# Patient Record
Sex: Female | Born: 1985 | Race: White | Hispanic: No | Smoking: Never smoker
Health system: Southern US, Community
[De-identification: ages and names within clinical notes are randomized; demographics above are authoritative.]

## PROBLEM LIST (undated history)

## (undated) DIAGNOSIS — G43909 Migraine, unspecified, not intractable, without status migrainosus: Secondary | ICD-10-CM

## (undated) DIAGNOSIS — R011 Cardiac murmur, unspecified: Secondary | ICD-10-CM

## (undated) DIAGNOSIS — K90829 Short bowel syndrome, unspecified: Secondary | ICD-10-CM

## (undated) DIAGNOSIS — E538 Deficiency of other specified B group vitamins: Secondary | ICD-10-CM

## (undated) DIAGNOSIS — K912 Postsurgical malabsorption, not elsewhere classified: Secondary | ICD-10-CM

## (undated) HISTORY — DX: Deficiency of other specified B group vitamins: E53.8

## (undated) HISTORY — DX: Short bowel syndrome, unspecified: K90.829

## (undated) HISTORY — DX: Postsurgical malabsorption, not elsewhere classified: K91.2

## (undated) HISTORY — PX: ABDOMINAL SURGERY: SHX537

---

## 2008-07-02 ENCOUNTER — Inpatient Hospital Stay (HOSPITAL_COMMUNITY): Admission: AD | Admit: 2008-07-02 | Discharge: 2008-07-02 | Payer: Self-pay | Admitting: Family Medicine

## 2008-10-07 ENCOUNTER — Inpatient Hospital Stay (HOSPITAL_COMMUNITY): Admission: AD | Admit: 2008-10-07 | Discharge: 2008-10-07 | Payer: Self-pay | Admitting: Obstetrics and Gynecology

## 2008-11-06 ENCOUNTER — Inpatient Hospital Stay (HOSPITAL_COMMUNITY): Admission: AD | Admit: 2008-11-06 | Discharge: 2008-11-06 | Payer: Self-pay | Admitting: Obstetrics and Gynecology

## 2008-11-06 ENCOUNTER — Ambulatory Visit: Payer: Self-pay | Admitting: Advanced Practice Midwife

## 2008-11-13 ENCOUNTER — Inpatient Hospital Stay (HOSPITAL_COMMUNITY): Admission: AD | Admit: 2008-11-13 | Discharge: 2008-11-13 | Payer: Self-pay | Admitting: Obstetrics & Gynecology

## 2008-11-17 ENCOUNTER — Inpatient Hospital Stay (HOSPITAL_COMMUNITY): Admission: AD | Admit: 2008-11-17 | Discharge: 2008-11-19 | Payer: Self-pay | Admitting: Obstetrics and Gynecology

## 2008-11-17 ENCOUNTER — Ambulatory Visit: Payer: Self-pay | Admitting: Family

## 2010-05-27 LAB — CBC
Hemoglobin: 10.5 g/dL — ABNORMAL LOW (ref 12.0–15.0)
MCHC: 32.8 g/dL (ref 30.0–36.0)
MCV: 87.1 fL (ref 78.0–100.0)
MCV: 88.6 fL (ref 78.0–100.0)
Platelets: 212 10*3/uL (ref 150–400)
Platelets: 264 10*3/uL (ref 150–400)
RBC: 2.64 MIL/uL — ABNORMAL LOW (ref 3.87–5.11)
RBC: 3.06 MIL/uL — ABNORMAL LOW (ref 3.87–5.11)
RDW: 15.4 % (ref 11.5–15.5)
WBC: 17.3 10*3/uL — ABNORMAL HIGH (ref 4.0–10.5)
WBC: 7.6 10*3/uL (ref 4.0–10.5)

## 2010-05-27 LAB — STREP B DNA PROBE: Strep Group B Ag: NEGATIVE

## 2010-05-27 LAB — RAPID URINE DRUG SCREEN, HOSP PERFORMED
Amphetamines: NOT DETECTED
Barbiturates: NOT DETECTED
Benzodiazepines: NOT DETECTED
Opiates: NOT DETECTED
Tetrahydrocannabinol: NOT DETECTED

## 2010-05-27 LAB — COMPREHENSIVE METABOLIC PANEL
ALT: 40 U/L — ABNORMAL HIGH (ref 0–35)
AST: 46 U/L — ABNORMAL HIGH (ref 0–37)
AST: 49 U/L — ABNORMAL HIGH (ref 0–37)
Alkaline Phosphatase: 135 U/L — ABNORMAL HIGH (ref 39–117)
Alkaline Phosphatase: 154 U/L — ABNORMAL HIGH (ref 39–117)
BUN: 8 mg/dL (ref 6–23)
CO2: 21 mEq/L (ref 19–32)
CO2: 21 mEq/L (ref 19–32)
Chloride: 106 mEq/L (ref 96–112)
Chloride: 108 mEq/L (ref 96–112)
Creatinine, Ser: 0.42 mg/dL (ref 0.4–1.2)
Creatinine, Ser: 0.47 mg/dL (ref 0.4–1.2)
GFR calc Af Amer: >60 mL/min (ref >60)
GFR calc non Af Amer: >60 mL/min (ref >60)
GFR calc non Af Amer: >60 mL/min (ref >60)
Potassium: 3.8 mEq/L (ref 3.5–5.1)
Potassium: 3.9 mEq/L (ref 3.5–5.1)
Sodium: 134 mEq/L — ABNORMAL LOW (ref 135–145)
Total Bilirubin: 0.6 mg/dL (ref 0.3–1.2)
Total Bilirubin: 0.8 mg/dL (ref 0.3–1.2)

## 2010-05-27 LAB — URINE MICROSCOPIC-ADD ON

## 2010-05-27 LAB — URINALYSIS, ROUTINE W REFLEX MICROSCOPIC
Bilirubin Urine: NEGATIVE
Bilirubin Urine: NEGATIVE
Hgb urine dipstick: NEGATIVE
Nitrite: NEGATIVE
Protein, ur: NEGATIVE mg/dL
Protein, ur: NEGATIVE mg/dL
Specific Gravity, Urine: 1.01 (ref 1.005–1.030)
Specific Gravity, Urine: 1.015 (ref 1.005–1.030)
Urobilinogen, UA: 0.2 mg/dL (ref 0.0–1.0)
Urobilinogen, UA: 0.2 mg/dL (ref 0.0–1.0)

## 2010-05-27 LAB — DIFFERENTIAL
Basophils Absolute: 0 10*3/uL (ref 0.0–0.1)
Basophils Relative: 0 % (ref 0–1)
Eosinophils Absolute: 0 10*3/uL (ref 0.0–0.7)
Eosinophils Relative: 1 % (ref 0–5)

## 2010-05-27 LAB — HEPATITIS B SURFACE ANTIGEN: Hepatitis B Surface Ag: NEGATIVE

## 2010-05-27 LAB — CCBB MATERNAL DONOR DRAW

## 2010-05-27 LAB — RPR: RPR Ser Ql: NONREACTIVE

## 2010-05-27 LAB — GC/CHLAMYDIA PROBE AMP, GENITAL: Chlamydia, DNA Probe: NEGATIVE

## 2010-05-27 LAB — WET PREP, GENITAL: Yeast Wet Prep HPF POC: NONE SEEN

## 2010-05-31 LAB — GC/CHLAMYDIA PROBE AMP, GENITAL
Chlamydia, DNA Probe: NEGATIVE
GC Probe Amp, Genital: NEGATIVE

## 2010-05-31 LAB — WET PREP, GENITAL: Yeast Wet Prep HPF POC: NONE SEEN

## 2010-05-31 LAB — URINALYSIS, ROUTINE W REFLEX MICROSCOPIC
Glucose, UA: NEGATIVE mg/dL
Ketones, ur: NEGATIVE mg/dL
Protein, ur: NEGATIVE mg/dL
Urobilinogen, UA: 0.2 mg/dL (ref 0.0–1.0)

## 2010-05-31 LAB — DIFFERENTIAL
Eosinophils Absolute: 0 10*3/uL (ref 0.0–0.7)
Lymphocytes Relative: 27 % (ref 12–46)
Lymphs Abs: 1.9 10*3/uL (ref 0.7–4.0)
Neutrophils Relative %: 63 % (ref 43–77)

## 2010-05-31 LAB — CBC
Platelets: 202 10*3/uL (ref 150–400)
WBC: 7.1 10*3/uL (ref 4.0–10.5)

## 2010-09-19 ENCOUNTER — Encounter: Payer: Self-pay | Admitting: Family Medicine

## 2011-06-30 ENCOUNTER — Encounter (HOSPITAL_COMMUNITY): Payer: Self-pay

## 2011-06-30 ENCOUNTER — Emergency Department (INDEPENDENT_AMBULATORY_CARE_PROVIDER_SITE_OTHER)
Admission: EM | Admit: 2011-06-30 | Discharge: 2011-06-30 | Disposition: A | Discharge status: Home / Self Care | Payer: Self-pay | Source: Home / Self Care | Attending: Emergency Medicine | Admitting: Emergency Medicine

## 2011-06-30 DIAGNOSIS — N390 Urinary tract infection, site not specified: Secondary | ICD-10-CM

## 2011-06-30 LAB — POCT URINALYSIS DIP (DEVICE)
Protein, ur: 100 mg/dL — AB
Urobilinogen, UA: 0.2 mg/dL (ref 0.0–1.0)

## 2011-06-30 LAB — POCT PREGNANCY, URINE: Preg Test, Ur: NEGATIVE

## 2011-06-30 MED ORDER — NITROFURANTOIN MONOHYD MACRO 100 MG PO CAPS: 100.0000 mg | ORAL_CAPSULE | Freq: Two times a day (BID) | ORAL | Status: AC

## 2011-06-30 NOTE — ED Provider Notes (Signed)
History     CSN: [id number]  Arrival date & time 06/30/11  1238   First MD Initiated Contact with Patient 06/30/11 1252      Chief Complaint  Patient presents with  . Vaginitis    (Consider location/radiation/quality/duration/timing/severity/associated sxs/prior treatment) Patient is a 26 y.o. female presenting with dysuria. The history is provided by the patient. No language interpreter was used.  Dysuria  This is a new problem. The current episode started yesterday. The problem occurs every urination. The problem has been gradually worsening. The pain is at a severity of 6/10. The pain is moderate. There has been no fever. The fever has been present for 3 to 4 days. She is not sexually active. There is no history of pyelonephritis. Associated symptoms include urgency. Pertinent negatives include no discharge, no hematuria and no hesitancy. Her past medical history does not include kidney stones or recurrent UTIs.    History reviewed. No pertinent past medical history.  History reviewed. No pertinent past surgical history.  History reviewed. No pertinent family history.  History  Substance Use Topics  . Smoking status: Never Smoker   . Smokeless tobacco: Never Used  . Alcohol Use: Yes    OB History    Grav Para Term Preterm Abortions TAB SAB Ect Mult Living                  Review of Systems  Genitourinary: Positive for dysuria and urgency. Negative for hesitancy and hematuria.  All other systems reviewed and are negative.    Allergies  Pollen extract  Home Medications  No current outpatient prescriptions on file.  BP 118/68  Pulse 78  Temp(Src) 97.8 F (36.6 C) (Oral)  Resp 18  SpO2 96%  LMP 06/26/2011  Physical Exam  Nursing note and vitals reviewed. Constitutional: She is oriented to person, place, and time. She appears well-developed and well-nourished.  HENT:  Head: Normocephalic and atraumatic.  Right Ear: External ear normal.  Left Ear:  External ear normal.  Eyes: Pupils are equal, round, and reactive to light.  Neck: Normal range of motion.  Cardiovascular: Normal rate.   Pulmonary/Chest: Effort normal.  Abdominal: Soft.  Musculoskeletal: Normal range of motion.  Neurological: She is alert and oriented to person, place, and time.  Skin: Skin is warm.  Psychiatric: She has a normal mood and affect.    ED Course  Procedures (including critical care time)  Labs Reviewed  POCT URINALYSIS DIP (DEVICE) - Abnormal; Notable for the following:    Hgb urine dipstick LARGE (*)    Protein, ur 100 (*)    Leukocytes, UA SMALL (*) Biochemical Testing Only. Please order routine urinalysis from main lab if confirmatory testing is needed.   All other components within normal limits  POCT PREGNANCY, URINE   No results found.   No diagnosis found.    MDM  Urine shows small leukocytes and large hemoglobin        Lonia Skinner [place], [place] 06/30/11 1325

## 2011-06-30 NOTE — ED Provider Notes (Signed)
Medical screening examination/treatment/procedure(s) were performed by non-physician practitioner and as supervising physician I was immediately available for consultation/collaboration.  Raynald Blend, MD 06/30/11 1435

## 2011-06-30 NOTE — Discharge Instructions (Signed)
Urinary Tract Infection A urinary tract infection (UTI) is often caused by a germ (bacteria). A UTI is usually helped with medicine (antibiotics) that kills germs. Take all the medicine until it is gone. Do this even if you are feeling better. You are usually better in 7 to 10 days. HOME CARE   Drink enough water and fluids to keep your pee (urine) clear or pale yellow. Drink:   Cranberry juice.   Water.   Avoid:   Caffeine.   Tea.   Bubbly (carbonated) drinks.   Alcohol.   Only take medicine as told by your doctor.   To prevent further infections:   Pee often.   After pooping (bowel movement), women should wipe from front to back. Use each tissue only once.   Pee before and after having sex (intercourse).  Ask your doctor when your test results will be ready. Make sure you follow up and get your test results.  GET HELP RIGHT AWAY IF:   There is very bad back pain or lower belly (abdominal) pain.   You get the chills.   You have a fever.   Your baby is older than 3 months with a rectal temperature of 102 F (38.9 C) or higher.   Your baby is 3 months old or younger with a rectal temperature of 100.4 F (38 C) or higher.   You feel sick to your stomach (nauseous) or throw up (vomit).   There is continued burning with peeing.   Your problems are not better in 3 days. Return sooner if you are getting worse.  MAKE SURE YOU:   Understand these instructions.   Will watch your condition.   Will get help right away if you are not doing well or get worse.  Document Released: 07/26/2007 Document Revised: 01/26/2011 Document Reviewed: 07/26/2007 Exit[employer] Patient Information 2012 [employer]. 

## 2011-06-30 NOTE — ED Notes (Signed)
C/o vaginal d/c , pain for past 2 days, pain w urination; sexually active w/o bc

## 2013-01-27 ENCOUNTER — Encounter (HOSPITAL_COMMUNITY): Payer: Self-pay | Admitting: Emergency Medicine

## 2013-01-27 ENCOUNTER — Emergency Department (INDEPENDENT_AMBULATORY_CARE_PROVIDER_SITE_OTHER)
Admission: EM | Admit: 2013-01-27 | Discharge: 2013-01-27 | Disposition: A | Discharge status: Home / Self Care | Payer: Self-pay | Source: Home / Self Care | Attending: Family Medicine | Admitting: Family Medicine

## 2013-01-27 DIAGNOSIS — J069 Acute upper respiratory infection, unspecified: Secondary | ICD-10-CM

## 2013-01-27 LAB — POCT RAPID STREP A: Streptococcus, Group A Screen (Direct): NEGATIVE

## 2013-01-27 MED ORDER — HYDROCODONE-ACETAMINOPHEN 5-325 MG PO TABS: 0.5000 | ORAL_TABLET | Freq: Every evening | ORAL | Status: DC | PRN

## 2013-01-27 MED ORDER — IPRATROPIUM BROMIDE 0.06 % NA SOLN: 2.0000 | Freq: Four times a day (QID) | NASAL | Status: DC

## 2013-01-27 MED ORDER — AZITHROMYCIN 250 MG PO TABS: 250.0000 mg | ORAL_TABLET | Freq: Every day | ORAL | Status: DC

## 2013-01-27 NOTE — ED Notes (Signed)
C/o St , fever x 1 week

## 2013-01-27 NOTE — ED Provider Notes (Signed)
Judy Fisher is a 27 y.o. female who presents to Urgent Care today for sore throat, nasal discharge, coughing congestion. This is been present for about one week. Patient has used over-the-counter medications which helped only a little. No chest pain trouble breathing nausea vomiting or diarrhea. Mild subjective fevers present. Patient feels well otherwise.   Past Medical History  Diagnosis Date  . B12 deficiency   . Short bowel syndrome    History  Substance Use Topics  . Smoking status: Never Smoker   . Smokeless tobacco: Not on file  . Alcohol Use: Not on file   ROS as above Medications reviewed. No current facility-administered medications for this encounter.   Current Outpatient Prescriptions  Medication Sig Dispense Refill  . azithromycin (ZITHROMAX) 250 MG tablet Take 1 tablet (250 mg total) by mouth daily. Take first 2 tablets together, then 1 every day until finished.  6 tablet  0  . HYDROcodone-acetaminophen (NORCO/VICODIN) 5-325 MG per tablet Take 0.5 tablets by mouth at bedtime as needed (cough).  6 tablet  0  . ipratropium (ATROVENT) 0.06 % nasal spray Place 2 sprays into both nostrils 4 (four) times daily.  15 mL  1    Exam:  BP 130/89  Pulse 63  Temp(Src) 97.3 F (36.3 C) (Oral)  Resp 20  SpO2 99% Gen: Well NAD HEENT: EOMI,  MMM, posterior pharynx with mild erythema and cobblestoning. Tympanic membranes are normal appearing on the left. Occluded by cerumen on the right. Lungs: Normal work of breathing. CTABL Heart: RRR no MRG Abd: NABS, Soft. NT, ND Exts: Non edematous BL  LE, warm and well perfused.   Results for orders placed during the hospital encounter of 01/27/13 (from the past 24 hour(s))  POCT RAPID STREP A ([facility] ONLY)     Status: None   Collection Time    01/27/13  2:51 PM      Result Value Range   Streptococcus, Group A Screen (Direct) NEGATIVE  NEGATIVE   No results found.  Assessment and Plan: 27 y.o. female with viral URI. Plan for  symptomatic management with Atrovent nasal spray, cough medication, and we'll use azithromycin if patient does not improve. Discussed warning signs or symptoms. Please see discharge instructions. Patient expresses understanding.      Rodolph Bong, MD 01/27/13 [phone]

## 2013-01-29 LAB — CULTURE, GROUP A STREP

## 2014-04-19 ENCOUNTER — Emergency Department (HOSPITAL_COMMUNITY)
Admission: EM | Admit: 2014-04-19 | Discharge: 2014-04-19 | Disposition: A | Discharge status: Home / Self Care | Payer: Worker's Compensation | Attending: Emergency Medicine | Admitting: Emergency Medicine

## 2014-04-19 ENCOUNTER — Emergency Department (HOSPITAL_COMMUNITY): Payer: Worker's Compensation

## 2014-04-19 ENCOUNTER — Encounter (HOSPITAL_COMMUNITY): Payer: Self-pay | Admitting: Physical Medicine and Rehabilitation

## 2014-04-19 DIAGNOSIS — Z79899 Other long term (current) drug therapy: Secondary | ICD-10-CM | POA: Insufficient documentation

## 2014-04-19 DIAGNOSIS — Z3202 Encounter for pregnancy test, result negative: Secondary | ICD-10-CM | POA: Insufficient documentation

## 2014-04-19 DIAGNOSIS — Z792 Long term (current) use of antibiotics: Secondary | ICD-10-CM | POA: Diagnosis not present

## 2014-04-19 DIAGNOSIS — S39012A Strain of muscle, fascia and tendon of lower back, initial encounter: Secondary | ICD-10-CM | POA: Insufficient documentation

## 2014-04-19 DIAGNOSIS — X58XXXA Exposure to other specified factors, initial encounter: Secondary | ICD-10-CM | POA: Diagnosis not present

## 2014-04-19 DIAGNOSIS — Z8639 Personal history of other endocrine, nutritional and metabolic disease: Secondary | ICD-10-CM | POA: Insufficient documentation

## 2014-04-19 DIAGNOSIS — Z8719 Personal history of other diseases of the digestive system: Secondary | ICD-10-CM | POA: Insufficient documentation

## 2014-04-19 DIAGNOSIS — S3992XA Unspecified injury of lower back, initial encounter: Secondary | ICD-10-CM | POA: Diagnosis present

## 2014-04-19 DIAGNOSIS — Y9389 Activity, other specified: Secondary | ICD-10-CM | POA: Diagnosis not present

## 2014-04-19 DIAGNOSIS — Y9289 Other specified places as the place of occurrence of the external cause: Secondary | ICD-10-CM | POA: Diagnosis not present

## 2014-04-19 DIAGNOSIS — Y990 Civilian activity done for income or pay: Secondary | ICD-10-CM | POA: Diagnosis not present

## 2014-04-19 DIAGNOSIS — R509 Fever, unspecified: Secondary | ICD-10-CM

## 2014-04-19 LAB — URINALYSIS, ROUTINE W REFLEX MICROSCOPIC
Bilirubin Urine: NEGATIVE
Glucose, UA: NEGATIVE mg/dL
HGB URINE DIPSTICK: NEGATIVE
KETONES UR: 15 mg/dL — AB
LEUKOCYTES UA: NEGATIVE
NITRITE: NEGATIVE
PH: 7.5 (ref 5.0–8.0)
Protein, ur: NEGATIVE mg/dL
Specific Gravity, Urine: 1.019 (ref 1.005–1.030)
Urobilinogen, UA: 1 mg/dL (ref 0.0–1.0)

## 2014-04-19 LAB — POC URINE PREG, ED: Preg Test, Ur: NEGATIVE

## 2014-04-19 MED ORDER — IBUPROFEN 100 MG/5ML PO SUSP: 10.0000 mg/kg | Freq: Three times a day (TID) | ORAL | Status: DC | PRN

## 2014-04-19 MED ORDER — IBUPROFEN 100 MG/5ML PO SUSP
10.0000 mg/kg | Freq: Once | ORAL | Status: AC
  Administered 2014-04-19: 650 mg via ORAL
  Filled 2014-04-19 (×2): qty 40

## 2014-04-19 NOTE — ED Provider Notes (Signed)
CSN: [id number]     Arrival date & time 04/19/14  [id number] History  This chart was scribed for non-physician practitioner, Fayrene HelperBowie Dreyah Montrose, PA-C,working with Vida RollerBrian D Miller, MD, by Karle PlumberJennifer Tensley, ED Scribe. This patient was seen in room TR05C/TR05C and the patient's care was started at 7:24 PM.  Chief Complaint  Patient presents with  . Back Pain   Patient is a 29 y.o. female presenting with back pain. The history is provided by the patient and medical records. No language interpreter was used.  Back Pain Associated symptoms: no fever and no numbness     HPI Comments:  Judy Fisher is a 29 y.o. female who presents to the Emergency Department complaining of severe, sharp back pain that began approximately 7-8 hours ago. She states she lifts heavy objects at work on a regular basis and uses proper techniques to do so. She states the pain is in her lower back and travels all the way up her back to the neck. She reports movements and walking makes the pain worse. Denies alleviating factors. She states she took on epsom salt bath with no significant relief of the pain. Denies CP, SOB, fever, cough, abdominal pain, problems with urination, bowel or bladder incontinence, numbness, tingling or weakness of any extremity or rash.     Past Medical History  Diagnosis Date  . B12 deficiency   . Short bowel syndrome    History reviewed. No pertinent past surgical history. No family history on file. History  Substance Use Topics  . Smoking status: Never Smoker   . Smokeless tobacco: Not on file  . Alcohol Use: No   OB History    No data available     Review of Systems  Constitutional: Negative for fever.  Musculoskeletal: Positive for back pain.  Neurological: Negative for numbness.    Allergies  Review of patient's allergies indicates no known allergies.  Home Medications   Prior to Admission medications   Medication Sig Start Date End Date Taking? Authorizing Provider  azithromycin  (ZITHROMAX) 250 MG tablet Take 1 tablet (250 mg total) by mouth daily. Take first 2 tablets together, then 1 every day until finished. 01/27/13   Rodolph BongEvan S Corey, MD  HYDROcodone-acetaminophen (NORCO/VICODIN) 5-325 MG per tablet Take 0.5 tablets by mouth at bedtime as needed (cough). 01/27/13   Rodolph BongEvan S Corey, MD  ipratropium (ATROVENT) 0.06 % nasal spray Place 2 sprays into both nostrils 4 (four) times daily. 01/27/13   Rodolph BongEvan S Corey, MD   Triage Vitals: BP 108/66 mmHg  Pulse 102  Temp(Src) 98.8 F (37.1 C)  Resp 16  SpO2 99% Physical Exam  Constitutional: She is oriented to person, place, and time. She appears well-developed and well-nourished.  HENT:  Head: Normocephalic and atraumatic.  Eyes: EOM are normal.  Neck: Normal range of motion.  Cardiovascular: Normal rate and intact distal pulses.   Pulmonary/Chest: Effort normal.  Abdominal: Soft. There is no tenderness.  Musculoskeletal: Normal range of motion. She exhibits tenderness (No sig midline tenderness, step off or crepitus. Tenderness throughout paraspinal muslce of left side involving thoracic and paraspinal muscles. Able to ambulate.).  Neurological: She is alert and oriented to person, place, and time. She has normal reflexes.  Skin: Skin is warm and dry.  Psychiatric: She has a normal mood and affect. Her behavior is normal.  Nursing note and vitals reviewed.   ED Course  Procedures (including critical care time) DIAGNOSTIC STUDIES: Oxygen Saturation is 99% on RA, normal by my interpretation.  COORDINATION OF CARE: 7:29 PM- Will prescribe Pt verbalizes understanding and agrees to plan.  7:57 PM Pt with reproducible L back pain 2/2 muscle strain.  No red flags.  RICE therapy discussed.  10:23 PM On vital sign rechecks, patient has a fever of 102.6 and was tachycardic. She denies having significant headache, neck stiffness, chest pain, productive cough, abdominal pain, pelvic pain dysuria, or rash. She has no midline spine  tenderness and no evidence of epidural abscess. Chest x-ray, UA, and pregnancy test was normal. Fever has resolved with ibuprofen. No history of IV drug use, no other red flags. Low suspicion for spinal infection or discitis. Patient felt better at this time. I strongly encouraged for patient to return if her symptoms worsen.  Medications  ibuprofen (ADVIL,MOTRIN) 100 MG/5ML suspension 650 mg (650 mg Oral Given 04/19/14 2028)    Labs Review Labs Reviewed  URINALYSIS, ROUTINE W REFLEX MICROSCOPIC - Abnormal; Notable for the following:    Ketones, ur 15 (*)    All other components within normal limits  POC URINE PREG, ED    Imaging Review Dg Chest 2 View  04/19/2014   CLINICAL DATA:  Fever and upper back pain.  EXAM: CHEST  2 VIEW  COMPARISON:  None.  FINDINGS: Normal heart size and mediastinal contours. No acute infiltrate or edema. No effusion or pneumothorax. No acute osseous findings.  IMPRESSION: Negative chest.   Electronically Signed   By: Marnee Spring M.D.   On: 04/19/2014 20:55     EKG Interpretation None      MDM   Final diagnoses:  Back strain, initial encounter  Fever    BP 108/66 mmHg  Pulse 102  Temp(Src) 98.8 F (37.1 C)  Resp 16  SpO2 99%   I personally performed the services described in this documentation, which was scribed in my presence. The recorded information has been reviewed and is accurate.      Fayrene Helper, PA-C 04/19/14 2225  Vida Roller, MD 04/20/14 2008

## 2014-04-19 NOTE — ED Notes (Signed)
Pt presents to department for evaluation of back pain. 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 Reports pain radiating down spine. 10/10 pain upon arrival, increases with movement.

## 2014-04-19 NOTE — ED Notes (Signed)
Pt reports she can not take any pills all meds need to be in liquid form.

## 2014-04-19 NOTE — ED Notes (Signed)
[place]  Discharge on hold for now, orders obtained.  Advised pt PA plan. Pt verbalized understanding.

## 2014-04-19 NOTE — Discharge Instructions (Signed)

## 2014-11-23 ENCOUNTER — Emergency Department (INDEPENDENT_AMBULATORY_CARE_PROVIDER_SITE_OTHER)
Admission: EM | Admit: 2014-11-23 | Discharge: 2014-11-23 | Disposition: A | Discharge status: Home / Self Care | Payer: Self-pay | Source: Home / Self Care | Attending: Family Medicine | Admitting: Family Medicine

## 2014-11-23 ENCOUNTER — Encounter (HOSPITAL_COMMUNITY): Payer: Self-pay | Admitting: *Deleted

## 2014-11-23 DIAGNOSIS — J069 Acute upper respiratory infection, unspecified: Secondary | ICD-10-CM

## 2014-11-23 DIAGNOSIS — H6123 Impacted cerumen, bilateral: Secondary | ICD-10-CM

## 2014-11-23 LAB — POCT RAPID STREP A: STREPTOCOCCUS, GROUP A SCREEN (DIRECT): NEGATIVE

## 2014-11-23 MED ORDER — IPRATROPIUM BROMIDE 0.06 % NA SOLN: 2.0000 | Freq: Four times a day (QID) | NASAL | Status: DC

## 2014-11-23 NOTE — ED Notes (Signed)
Pt  Reports  Symptoms  Of  sorethroat  /   Congested  /  Cough     With  Sinus  Drainage

## 2014-11-23 NOTE — ED Provider Notes (Signed)
CSN: [id number]     Arrival date & time 11/23/14  1833 History   First MD Initiated Contact with Patient 11/23/14 2014     Chief Complaint  Patient presents with  . Sore Throat   (Consider location/radiation/quality/duration/timing/severity/associated sxs/prior Treatment) Patient is a 29 y.o. female presenting with pharyngitis. The history is provided by the patient.  Sore Throat This is a new problem. The current episode started 2 days ago. The problem has not changed since onset.Pertinent negatives include no chest pain and no abdominal pain. Associated symptoms comments: Ears plugged bilat.Marland Kitchen    History reviewed. No pertinent past medical history. No past surgical history on file. No family history on file. Social History  Substance Use Topics  . Smoking status: Never Smoker   . Smokeless tobacco: Never Used  . Alcohol Use: No   OB History    No data available     Review of Systems  Constitutional: Negative.   HENT: Positive for congestion, ear pain, postnasal drip, rhinorrhea and sore throat.   Respiratory: Negative.   Cardiovascular: Negative.  Negative for chest pain.  Gastrointestinal: Negative for abdominal pain.  All other systems reviewed and are negative.   Allergies  Pollen extract  Home Medications   Prior to Admission medications   Medication Sig Start Date End Date Taking? Authorizing Provider  ipratropium (ATROVENT) 0.06 % nasal spray Place 2 sprays into both nostrils 4 (four) times daily. 11/23/14   Linna Hoff, MD   Meds Ordered and Administered this Visit  Medications - No data to display  BP 120/78 mmHg  Pulse 57  Temp(Src) 98 F (36.7 C) (Oral)  Resp 12  SpO2 97%  LMP 11/02/2014 No data found.   Physical Exam  Constitutional: She is oriented to person, place, and time. She appears well-developed and well-nourished. No distress.  HENT:  Right Ear: No drainage. A foreign body is present. No mastoid tenderness. Decreased hearing is noted.    Left Ear: No drainage. A foreign body is present. No mastoid tenderness. Decreased hearing is noted.  Ears:  Mouth/Throat: Oropharynx is clear and moist.  Eyes: Pupils are equal, round, and reactive to light.  Neck: Normal range of motion. Neck supple.  Lymphadenopathy:    She has no cervical adenopathy.  Neurological: She is alert and oriented to person, place, and time.  Skin: Skin is warm and dry.  Nursing note and vitals reviewed.   ED Course  Procedures (including critical care time)  Labs Review Labs Reviewed  POCT RAPID STREP A    Imaging Review No results found.   Visual Acuity Review  Right Eye Distance:   Left Eye Distance:   Bilateral Distance:    Right Eye Near:   Left Eye Near:    Bilateral Near:         MDM   1. URI (upper respiratory infection)   2. Cerumen impaction, bilateral        Linna Hoff, MD 11/23/14 2053

## 2014-11-26 LAB — CULTURE, GROUP A STREP: Strep A Culture: NEGATIVE

## 2014-11-30 NOTE — ED Notes (Signed)
Final report of strep testing negative, no further action required 

## 2015-04-12 ENCOUNTER — Emergency Department (HOSPITAL_COMMUNITY)
Admission: EM | Admit: 2015-04-12 | Discharge: 2015-04-12 | Disposition: A | Discharge status: Home / Self Care | Payer: Self-pay | Attending: Emergency Medicine | Admitting: Emergency Medicine

## 2015-04-12 ENCOUNTER — Emergency Department (HOSPITAL_COMMUNITY): Payer: Self-pay

## 2015-04-12 ENCOUNTER — Encounter (HOSPITAL_COMMUNITY): Payer: Self-pay | Admitting: Family Medicine

## 2015-04-12 DIAGNOSIS — R008 Other abnormalities of heart beat: Secondary | ICD-10-CM | POA: Insufficient documentation

## 2015-04-12 DIAGNOSIS — Z8639 Personal history of other endocrine, nutritional and metabolic disease: Secondary | ICD-10-CM | POA: Insufficient documentation

## 2015-04-12 DIAGNOSIS — Z79899 Other long term (current) drug therapy: Secondary | ICD-10-CM | POA: Insufficient documentation

## 2015-04-12 DIAGNOSIS — Z8719 Personal history of other diseases of the digestive system: Secondary | ICD-10-CM | POA: Insufficient documentation

## 2015-04-12 DIAGNOSIS — R079 Chest pain, unspecified: Secondary | ICD-10-CM | POA: Insufficient documentation

## 2015-04-12 LAB — BASIC METABOLIC PANEL
Anion gap: 7 (ref 5–15)
BUN: 8 mg/dL (ref 6–20)
CHLORIDE: 107 mmol/L (ref 101–111)
CO2: 26 mmol/L (ref 22–32)
Calcium: 8.7 mg/dL — ABNORMAL LOW (ref 8.9–10.3)
Creatinine, Ser: 0.65 mg/dL (ref 0.44–1.00)
GFR calc Af Amer: >60 mL/min (ref >60)
GFR calc non Af Amer: >60 mL/min (ref >60)
GLUCOSE: 104 mg/dL — AB (ref 65–99)
POTASSIUM: 3.8 mmol/L (ref 3.5–5.1)
Sodium: 140 mmol/L (ref 135–145)

## 2015-04-12 LAB — CBC
HEMATOCRIT: 34.5 % — AB (ref 36.0–46.0)
Hemoglobin: 11.7 g/dL — ABNORMAL LOW (ref 12.0–15.0)
MCH: 29.3 pg (ref 26.0–34.0)
MCHC: 33.9 g/dL (ref 30.0–36.0)
MCV: 86.3 fL (ref 78.0–100.0)
Platelets: 230 10*3/uL (ref 150–400)
RBC: 4 MIL/uL (ref 3.87–5.11)
RDW: 13 % (ref 11.5–15.5)
WBC: 7.5 10*3/uL (ref 4.0–10.5)

## 2015-04-12 LAB — I-STAT TROPONIN, ED
Troponin i, poc: 0 ng/mL (ref 0.00–0.08)
Troponin i, poc: 0 ng/mL (ref 0.00–0.08)

## 2015-04-12 MED ORDER — ACETAMINOPHEN 325 MG PO TABS
650.0000 mg | ORAL_TABLET | Freq: Once | ORAL | Status: AC
  Administered 2015-04-12: 650 mg via ORAL
  Filled 2015-04-12: qty 2

## 2015-04-12 NOTE — ED Notes (Signed)
Pt is complaining of generalized chest pain that started yesterday morning around 9am. Pt reports she noticed shortness of breath or heavy breathing around 8am.

## 2015-04-12 NOTE — ED Provider Notes (Signed)
CSN: [id number]     Arrival date & time 04/12/15  0148 History   First MD Initiated Contact with Patient 04/12/15 0630     Chief Complaint  Patient presents with  . Chest Pain     (Consider location/radiation/quality/duration/timing/severity/associated sxs/prior Treatment) HPI   Judy Fisher is a 30 y.o. female, with a history of B12 deficiency, presenting to the ED with chest pressure and heavy breathing that started yesterday at about 9am. 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 Pt has not taken anything for the pain. Pt rates her pain at 7/10 when it started, but now rates it at a 1/10, which is the lowest its been for the last 24 hours. The pain is nonradiating. Pt states she has felt this pain before, but never this bad. Pt states the pain improved with "slow breathing, not thinking about stressful things, and just relaxing." Pt denies N/V, shortness of breath, diaphoresis, cough, or any other complaints. Patient adds that her father has had 3 MIs, with the first one after age 31.   Past Medical History  Diagnosis Date  . B12 deficiency   . Short bowel syndrome    History reviewed. No pertinent past surgical history. Family History  Problem Relation Age of Onset  . Hypertension Father   . Heart attack Father 17    Three separate MIs   Social History  Substance Use Topics  . Smoking status: Never Smoker   . Smokeless tobacco: None  . Alcohol Use: No   OB History    No data available     Review of Systems  Constitutional: Negative for fever, chills and diaphoresis.  Respiratory: Negative for shortness of breath.   Cardiovascular: Positive for chest pain.  Gastrointestinal: Negative for nausea and vomiting.  Skin: Negative for color change and pallor.  Neurological: Negative for dizziness, light-headedness and headaches.  All other systems  reviewed and are negative.     Allergies  Review of patient's allergies indicates no known allergies.  Home Medications   Prior to Admission medications   Medication Sig Start Date End Date Taking? Authorizing Provider  azithromycin (ZITHROMAX) 250 MG tablet Take 1 tablet (250 mg total) by mouth daily. Take first 2 tablets together, then 1 every day until finished. 01/27/13   Rodolph Bong, MD  HYDROcodone-acetaminophen (NORCO/VICODIN) 5-325 MG per tablet Take 0.5 tablets by mouth at bedtime as needed (cough). 01/27/13   Rodolph Bong, MD  ibuprofen (ADVIL,MOTRIN) 100 MG/5ML suspension Take 32.5 mLs (650 mg total) by mouth every 8 (eight) hours as needed. 04/19/14   Fayrene Helper, PA-C  ipratropium (ATROVENT) 0.06 % nasal spray Place 2 sprays into both nostrils 4 (four) times daily. 01/27/13   Rodolph Bong, MD   BP 100/72 mmHg  Pulse 60  Temp(Src) 98.3 F (36.8 C) (Oral)  Resp 14  Ht  (1.549 m)  Wt 45.36 kg  BMI 18.90 kg/m2  SpO2 100%  LMP 04/11/2015 Physical Exam  Constitutional: She is oriented to person, place, and time. She appears well-developed and well-nourished. No distress.  HENT:  Head: Normocephalic and atraumatic.  Eyes: Conjunctivae are normal. Pupils are equal, round, and reactive to light.  Neck: Neck supple.  Cardiovascular: Normal rate, regular rhythm and intact distal pulses.  Exam reveals gallop and S3.   Pulmonary/Chest: Effort normal and breath sounds normal. No  respiratory distress.  Abdominal: Soft. Bowel sounds are normal. There is no tenderness. There is no guarding.  Musculoskeletal: She exhibits no edema or tenderness.  Lymphadenopathy:    She has no cervical adenopathy.  Neurological: She is alert and oriented to person, place, and time.  Skin: Skin is warm and dry. She is not diaphoretic.  Nursing note and vitals reviewed.   ED Course  Procedures (including critical care time) Labs Review Labs Reviewed  BASIC METABOLIC PANEL - Abnormal; Notable  for the following:    Glucose, Bld 104 (*)    Calcium 8.7 (*)    All other components within normal limits  CBC - Abnormal; Notable for the following:    Hemoglobin 11.7 (*)    HCT 34.5 (*)    All other components within normal limits  I-STAT TROPOININ, ED  I-STAT TROPOININ, ED    Imaging Review Dg Chest 2 View  04/12/2015  CLINICAL DATA:  Chest pressure and shortness of breath for 1 day. EXAM: CHEST  2 VIEW COMPARISON:  04/19/2014 FINDINGS: The cardiomediastinal contours are normal. The lungs are clear. Pulmonary vasculature is normal. No consolidation, pleural effusion, or pneumothorax. No acute osseous abnormalities are seen. IMPRESSION: No acute pulmonary process. Electronically Signed   By: Rubye Oaks M.D.   On: 04/12/2015 03:00   I have personally reviewed and evaluated these images and lab results as part of my medical decision-making.   EKG Interpretation   Date/Time:  Monday April 12 2015 02:00:50 EST Ventricular Rate:  74 PR Interval:  149 QRS Duration: 81 QT Interval:  363 QTC Calculation: 403 R Axis:   83 Text Interpretation:  Sinus rhythm Nonspecific T wave abnormality No prior  ECG Confirmed by [facility] MD, ERIN ([id number]) on 04/12/2015 7:01:27 AM      MDM   Final diagnoses:  Chest pain, unspecified chest pain type    Judy Fisher presents with chest pressure for the last 24 hours.  Findings and plan of care discussed with Alvira Monday, MD.  Low suspicion for ACS. HEART score is 1, indicating low risk for a cardiac event. Wells criteria score is 0, indicating low risk for PE. Delta troponins negative. Patient has a new murmur, but she seems to be asymptomatic to it at this time. Patient's pain spontaneously resolved. She shows no signs of fluid overload or heart failure. Patient will need to follow up with PCP within a week. Resource guide was given. The patient was given strict return precautions. Patient voices understanding of these instructions,  accepts the plan, and is comfortable with discharge.  Filed Vitals:   04/12/15 0200 04/12/15 0626  BP: 122/83 100/72  Pulse: 82 60  Temp: 98.3 F (36.8 C)   TempSrc: Oral   Resp: 13 14  Height:  (1.549 m)   Weight: 45.36 kg   SpO2: 100% 100%     Anselm Pancoast, PA-C 04/12/15 0746  Alvira Monday, MD 04/12/15 2318

## 2015-04-12 NOTE — Discharge Instructions (Signed)
You have been seen today for chest pain. Your imaging and lab tests showed no abnormalities. Follow up with PCP within the next week for follow-up and chronic management. Return to ED should symptoms recur. There was a slight murmur detected on your exam. This should be followed up with by a PCP.   Emergency Department Resource Guide 1) Find a Doctor and Pay Out of Pocket Although you won't have to find out who is covered by your insurance plan, it is a good idea to ask around and get recommendations. You will then need to call the office and see if the doctor you have chosen will accept you as a new patient and what types of options they offer for patients who are self-pay. Some doctors offer discounts or will set up payment plans for their patients who do not have insurance, but you will need to ask so you aren't surprised when you get to your appointment.  2) Contact Your Local Health Department Not all health departments have doctors that can see patients for sick visits, but many do, so it is worth a call to see if yours does. If you don't know where your local health department is, you can check in your phone book. The CDC also has a tool to help you locate your state's health department, and many state websites also have listings of all of their local health departments.  3) Find a Walk-in Clinic If your illness is not likely to be very severe or complicated, you may want to try a walk in clinic. These are popping up all over the country in pharmacies, drugstores, and shopping centers. They're usually staffed by nurse practitioners or physician assistants that have been trained to treat common illnesses and complaints. They're usually fairly quick and inexpensive. However, if you have serious medical issues or chronic medical problems, these are probably not your best option.  No Primary Care Doctor: - Call Health Connect at  [phone] - they can help you locate a primary care doctor that   accepts your insurance, provides certain services, etc. - Physician Referral Service- [phone]  Chronic Pain Problems: Organization         Address  Phone   Notes  [facility]  [phone] Patients need to be referred by their primary care doctor.   Medication Assistance: Organization         Address  Phone   Notes  [facility] Medication [facility] [place] [address] [place] [place], [place] [address] [phone] --Must be a resident of [facility] -- Must have NO insurance coverage whatsoever (no Medicaid/ Medicare, etc.) -- The pt. MUST have a primary care doctor that directs their care regularly and follows them in the community   [facility]  [phone]   [employer]  [phone]    Agencies that provide inexpensive medical care: Organization         Address  Phone   Notes  [facility]  [phone]   [facility]    [phone]   [facility] [address] [place], [place] [address] [phone]   [facility] [address] [place] [place] [phone]   Planned Parenthood    [phone]   [facility]    [phone]   [facility]  [address], [place] Phone:  [phone], Fax:  [phone] Hours of  Operation:  9 am - 6 pm, M-F.  Also accepts Medicaid/Medicare and self-pay.  [facility] for [place], [address], [place] Phone: [phone], Fax: [phone]. Hours of Operation:  8:30 am - 5:30 pm, M-F.  Also accepts Medicaid and self-pay.  [facility] [address], [place] Phone: [phone]   [place], [place], [place] [phone], Ext. [phone] Mondays & Thursdays: 7-9 AM.  First 15 patients are seen on a first come, first serve basis.    [place] Providers:  Organization          Address  Phone   Notes  [facility] [address], [place] [phone] Also accepts self-pay patients.  [facility] [address], [place]  [phone]   [place], [address], [place] [phone]   [facility] [address] [place] [phone]   Lucianne Lei [address], [place]   [phone] Only accepts [place] Access [place] patients after they have their name applied to their card.   Self-Pay (no insurance) in [facility]:  Organization         Address  Phone   Notes  Sickle Cell Patients, [facility] [place] [phone]   [facility] [place] [phone]   [facility] [place]  [place], [place], [place] [phone]   [facility]/Dr. Osei-Bonsu  [address] [place] or [address], [place] [phone] Phone number for both [place] and [place] locations is the same.  [facility] [address] [place] [phone]   [facility] [address], [place] or [address] [phone]   [facility] [address], [place] [phone], phone; [phone], fax Sees patients 1st and 3rd Saturday of every month.  Must not qualify for public or private insurance (i.e. Medicaid, Medicare, [place] Health Choice, Veterans' Benefits)  Household income should be no more than 200% of the poverty level The clinic cannot treat you if you are pregnant or think you are pregnant  Sexually transmitted diseases are not treated at the clinic.    Dental Care: Organization         Address  Phone  Notes  [facility] Department of [facility] [place] [phone] Accepts children up to age 47 who are enrolled in [place] or [place]; pregnant women with a Medicaid card; and children who have applied for Medicaid or [place] Health Choice, but were declined, whose parents can pay a reduced fee at time of service.  [facility] Department of [facility]  [address], [place] [phone] Accepts children up to age 82 who are enrolled in [place] or [place]; pregnant women with a Medicaid card; and children who have applied for Medicaid or [place] Health Choice, but were declined, whose parents can pay a reduced fee at time of service.  [facility]  [place] [phone] Patients are seen by appointment only. Walk-ins are not accepted. [place] will see patients  36 years of age and older. Monday - Tuesday (8am-5pm) Most Wednesdays (8:30-5pm) $30 per visit, cash only  [facility] Adult [facility] Access PRO[facility]  [address], [facility] [phone] Patients are seen by appointment only. Walk-ins are not accepted. [place] will see patients 34 years of age and older. One Wednesday Evening (Monthly: Volunteer Based).  $30 per visit, cash only  [place]  [phone] for adults; Children under age 73, call Graduate Pediatric Dentistry at [phone]. Children aged 46-14, please call [phone] to request a pediatric application.  Dental services are provided in all areas of dental care including fillings, crowns and bridges, complete and partial dentures, implants, gum treatment, root canals, and extractions. Preventive care is also provided. Treatment is provided to both adults and children. Patients are selected via a lottery and there is often a waiting list.   [facility] [address] [place]  [phone] [email or web address]   [facility] [address] [place], [place]  [phone], Ext. [phone] Second and Fourth Thursday of each month, opens at 6:30 AM; Clinic ends at 9 AM.  Patients are seen on a first-come first-served basis, and a limited number are seen during each clinic.   [facility]  [address] [place] Danker [place], [place] [phone]   Eligibility Requirements You must have lived in [place], [place], or [place] counties for at least the last three months.   You cannot be eligible for state or federal sponsored [employer], including [employer], [place], or [employer].   You generally cannot be eligible for healthcare insurance through your employer.    How to apply: Eligibility screenings are held every Tuesday and Wednesday afternoon from 1:00 pm until 4:00 pm. You do not need an appointment for the interview!  [facility] [address] [place], [place]   [place]  [place] Department  [place]  [phone]    Behavioral Health Resources in the Community: Intensive Outpatient Programs Organization         Address  Phone  Notes  [facility] [place]. [address] [place], [place] [phone]   [facility] Outpatient [address], [place], [place]   ADS: [facility] [address], [place], [place]   [place] [address]  [place], [place] or [phone]   Substance Abuse Resources Organization         Address  Phone  Notes  Alcohol and Drug Services  [phone]   [place]  [phone]   [facility] [place]   Chinita Pester  [phone]   Residential & Outpatient Substance Abuse Program  [phone]   Psychological Services Organization         Address  Phone  Notes  [facility] [place]  [place]  [phone]    [place] [address], [place] or [phone]    Mobile Crisis Teams Organization         Address  Phone  Notes  [facility], Mobile Crisis Care Unit  [phone]   [facility]  [address] [place], [place]   [facility] [address] [place] [phone]    Self-Help/Support Groups Organization  Address  Phone             Notes  [place]. of [place] - variety of support groups  [place] Call for more information  Narcotics Anonymous (NA), [facility] [address], [employer] [place]  2 meetings at this location   Special educational needs teacher         Address  Phone  Notes  [facility] [place],    [place]  [phone]   [facility]  [address] [place] [address], [place], [place]   [place], [place] [phone] Admissions: 8am-3pm M-F  Incentives Substance [place] [address],    [place], [place] [phone]   [facility] [address] [place], [place], [place]   [facility] [address]  [place], [place]   [facility] - Intensive Outpatient [address], [place], [place]   [facility] ([place].,  [place], [place] [phone] or [phone]   [facility] (RTS) [address] [place], [place] Accepts Medicaid  [facility] [place] [address]  [place] [phone] Substance Abuse/Addiction Treatment   [facility] Organization         Address  Phone  Notes  [facility]  [phone]   Domenic Schwab, PhD [address] [place], [place]   [phone] or [phone]   [place]  [place], [place] [phone]   [facility] [address] [place], [place] [phone] Insurance/Medicaid/sponsorship through [facility] and Families [address] [place]                                    [place], [place] [phone] [place] [address], [place] [phone]    Dr. Adele Schilder  [phone]   [facility] Dept. 1) [address], [place] 2) [place] 3)  [place] [address], [place] [phone]  [phone]   [place] [phone] or [phone] (After Hours)

## 2015-04-12 NOTE — ED Notes (Signed)
Patient is alert and oriented x3.  She was given DC instructions and follow up visit instructions.  Patient gave verbal understanding. She was DC ambulatory under her own power to home.  V/S stable.  He was not showing any signs of distress on DC 

## 2015-04-15 ENCOUNTER — Encounter (HOSPITAL_COMMUNITY): Payer: Self-pay | Admitting: *Deleted

## 2015-05-01 ENCOUNTER — Emergency Department (HOSPITAL_COMMUNITY)
Admission: EM | Admit: 2015-05-01 | Discharge: 2015-05-01 | Disposition: A | Discharge status: Home / Self Care | Payer: Self-pay | Attending: Emergency Medicine | Admitting: Emergency Medicine

## 2015-05-01 ENCOUNTER — Encounter (HOSPITAL_COMMUNITY): Payer: Self-pay | Admitting: *Deleted

## 2015-05-01 DIAGNOSIS — Z8639 Personal history of other endocrine, nutritional and metabolic disease: Secondary | ICD-10-CM | POA: Insufficient documentation

## 2015-05-01 DIAGNOSIS — G43809 Other migraine, not intractable, without status migrainosus: Secondary | ICD-10-CM | POA: Insufficient documentation

## 2015-05-01 DIAGNOSIS — Z8719 Personal history of other diseases of the digestive system: Secondary | ICD-10-CM | POA: Insufficient documentation

## 2015-05-01 DIAGNOSIS — R011 Cardiac murmur, unspecified: Secondary | ICD-10-CM | POA: Insufficient documentation

## 2015-05-01 HISTORY — DX: Cardiac murmur, unspecified: R01.1

## 2015-05-01 HISTORY — DX: Migraine, unspecified, not intractable, without status migrainosus: G43.909

## 2015-05-01 MED ORDER — MORPHINE SULFATE (PF) 2 MG/ML IV SOLN
2.0000 mg | Freq: Once | INTRAVENOUS | Status: AC
  Administered 2015-05-01: 2 mg via INTRAMUSCULAR
  Filled 2015-05-01: qty 1

## 2015-05-01 MED ORDER — METOCLOPRAMIDE HCL 5 MG/ML IJ SOLN
10.0000 mg | Freq: Once | INTRAMUSCULAR | Status: AC
  Administered 2015-05-01: 10 mg via INTRAMUSCULAR
  Filled 2015-05-01: qty 2

## 2015-05-01 MED ORDER — DIPHENHYDRAMINE HCL 50 MG/ML IJ SOLN
25.0000 mg | Freq: Once | INTRAMUSCULAR | Status: AC
  Administered 2015-05-01: 25 mg via INTRAMUSCULAR
  Filled 2015-05-01: qty 1

## 2015-05-01 MED ORDER — HYDROCODONE-ACETAMINOPHEN 5-325 MG PO TABS: 1.0000 | ORAL_TABLET | Freq: Once | ORAL | Status: DC

## 2015-05-01 MED ORDER — BUTALBITAL-APAP-CAFFEINE 50-325-40 MG PO TABS: 1.0000 | ORAL_TABLET | Freq: Four times a day (QID) | ORAL | Status: DC | PRN

## 2015-05-01 NOTE — ED Notes (Signed)
Pt reports migraine that began yesterday and lower back pain. Pt admits to hx of migraines. Denies any urinary symptoms or fever. Pt took tylenol at home w/o relief.

## 2015-05-01 NOTE — ED Notes (Signed)
Per pt work lights, stress, and anxiety trigger migraines. Migraine symptoms include throbbing pain, "ringing" in her head, blurry vision, and sensitivity to light but not sound.

## 2015-05-01 NOTE — ED Provider Notes (Signed)
CSN: [id number]     Arrival date & time 05/01/15  1046 History   First MD Initiated Contact with Patient 05/01/15 1250     Chief Complaint  Patient presents with  . Headache  . Back Pain     (Consider location/radiation/quality/duration/timing/severity/associated sxs/prior Treatment) HPI Comments: Patient here complaining of migraine which began yesterday. Does have history of same and this is similar. No fever or neck pain. Does endorse photophobia without phonophobia. Location of the headache is bitemporal and is characterized as squeezing. Denies any focal neurological deficits. No visual changes. Denies any recent history of head trauma. Has used over-the-counter medications without relief.  The history is provided by the patient.    Past Medical History  Diagnosis Date  . B12 deficiency   . Short bowel syndrome   . Migraines   . Heart murmur    History reviewed. No pertinent past surgical history. Family History  Problem Relation Age of Onset  . Hypertension Father   . Heart attack Father 22    Three separate MIs   Social History  Substance Use Topics  . Smoking status: Never Smoker   . Smokeless tobacco: None  . Alcohol Use: No   OB History    Gravida Para Term Preterm AB TAB SAB Ectopic Multiple Living   0 0 0 0 0 0 0 0       Review of Systems  All other systems reviewed and are negative.     Allergies  Ibuprofen and Pollen extract  Home Medications   Prior to Admission medications   Medication Sig Start Date End Date Taking? Authorizing Provider  acetaminophen (TYLENOL) 500 MG tablet Take 1,500 mg by mouth once.   Yes Historical Provider, MD  ipratropium (ATROVENT) 0.06 % nasal spray Place 2 sprays into both nostrils 4 (four) times daily. Patient not taking: Reported on 05/01/2015 11/23/14   Linna Hoff, MD   BP 103/73 mmHg  Pulse 85  Temp(Src) 98.4 F (36.9 C) (Oral)  Resp 16  SpO2 98%  LMP 04/11/2015 (Approximate) Physical Exam   Constitutional: She is oriented to person, place, and time. She appears well-developed and well-nourished.  Non-toxic appearance. No distress.  HENT:  Head: Normocephalic and atraumatic.  Eyes: Conjunctivae, EOM and lids are normal. Pupils are equal, round, and reactive to light.  Neck: Normal range of motion. Neck supple. No tracheal deviation present. No thyroid mass present.  Cardiovascular: Normal rate, regular rhythm and normal heart sounds.  Exam reveals no gallop.   No murmur heard. Pulmonary/Chest: Effort normal and breath sounds normal. No stridor. No respiratory distress. She has no decreased breath sounds. She has no wheezes. She has no rhonchi. She has no rales.  Abdominal: Soft. Normal appearance and bowel sounds are normal. She exhibits no distension. There is no tenderness. There is no rebound and no CVA tenderness.  Musculoskeletal: Normal range of motion. She exhibits no edema or tenderness.  Neurological: She is alert and oriented to person, place, and time. She has normal strength. No cranial nerve deficit or sensory deficit. GCS eye subscore is 4. GCS verbal subscore is 5. GCS motor subscore is 6.  Skin: Skin is warm and dry. No abrasion and no rash noted.  Psychiatric: She has a normal mood and affect. Her speech is normal and behavior is normal.  Nursing note and vitals reviewed.   ED Course  Procedures (including critical care time) Labs Review Labs Reviewed - No data to display  Imaging Review No  results found. I have personally reviewed and evaluated these images and lab results as part of my medical decision-making.   EKG Interpretation None      MDM   Final diagnoses:  None    Patient given meds for migraine and feels better. Do not think this represents subarachnoid or meningitis. Likely migraine and stable for discharge    Lorre NickAnthony Juwon Scripter, MD 05/01/15 [phone]

## 2015-05-01 NOTE — Discharge Instructions (Signed)

## 2015-05-10 ENCOUNTER — Emergency Department (HOSPITAL_COMMUNITY)
Admission: EM | Admit: 2015-05-10 | Discharge: 2015-05-10 | Disposition: A | Discharge status: Home / Self Care | Payer: Self-pay | Attending: Emergency Medicine | Admitting: Emergency Medicine

## 2015-05-10 ENCOUNTER — Emergency Department (HOSPITAL_COMMUNITY): Payer: Self-pay

## 2015-05-10 ENCOUNTER — Encounter (HOSPITAL_COMMUNITY): Payer: Self-pay | Admitting: Emergency Medicine

## 2015-05-10 DIAGNOSIS — Z8719 Personal history of other diseases of the digestive system: Secondary | ICD-10-CM | POA: Insufficient documentation

## 2015-05-10 DIAGNOSIS — R011 Cardiac murmur, unspecified: Secondary | ICD-10-CM | POA: Insufficient documentation

## 2015-05-10 DIAGNOSIS — H538 Other visual disturbances: Secondary | ICD-10-CM | POA: Insufficient documentation

## 2015-05-10 DIAGNOSIS — Z8639 Personal history of other endocrine, nutritional and metabolic disease: Secondary | ICD-10-CM | POA: Insufficient documentation

## 2015-05-10 DIAGNOSIS — Z3202 Encounter for pregnancy test, result negative: Secondary | ICD-10-CM | POA: Insufficient documentation

## 2015-05-10 DIAGNOSIS — R519 Headache, unspecified: Secondary | ICD-10-CM

## 2015-05-10 DIAGNOSIS — R1110 Vomiting, unspecified: Secondary | ICD-10-CM | POA: Insufficient documentation

## 2015-05-10 DIAGNOSIS — R079 Chest pain, unspecified: Secondary | ICD-10-CM | POA: Insufficient documentation

## 2015-05-10 DIAGNOSIS — R51 Headache: Secondary | ICD-10-CM | POA: Insufficient documentation

## 2015-05-10 LAB — BASIC METABOLIC PANEL
ANION GAP: 8 (ref 5–15)
BUN: 9 mg/dL (ref 6–20)
CALCIUM: 8.7 mg/dL — AB (ref 8.9–10.3)
CO2: 23 mmol/L (ref 22–32)
Chloride: 108 mmol/L (ref 101–111)
Creatinine, Ser: 0.67 mg/dL (ref 0.44–1.00)
Glucose, Bld: 95 mg/dL (ref 65–99)
POTASSIUM: 3.6 mmol/L (ref 3.5–5.1)
Sodium: 139 mmol/L (ref 135–145)

## 2015-05-10 LAB — CBC
HEMATOCRIT: 36 % (ref 36.0–46.0)
HEMOGLOBIN: 12.5 g/dL (ref 12.0–15.0)
MCH: 29 pg (ref 26.0–34.0)
MCHC: 34.7 g/dL (ref 30.0–36.0)
MCV: 83.5 fL (ref 78.0–100.0)
Platelets: 276 10*3/uL (ref 150–400)
RBC: 4.31 MIL/uL (ref 3.87–5.11)
RDW: 12.7 % (ref 11.5–15.5)
WBC: 12.2 10*3/uL — AB (ref 4.0–10.5)

## 2015-05-10 LAB — I-STAT TROPONIN, ED: TROPONIN I, POC: 0 ng/mL (ref 0.00–0.08)

## 2015-05-10 LAB — I-STAT BETA HCG BLOOD, ED (MC, WL, AP ONLY): I-stat hCG, quantitative: <5 m[IU]/mL (ref <5)

## 2015-05-10 NOTE — Discharge Instructions (Signed)
Ibuprofen 600 mg every 6 hours as needed for pain.   General Headache Without Cause A headache is pain or discomfort felt around the head or neck area. The specific cause of a headache may not be found. There are many causes and types of headaches. A few common ones are:  Tension headaches.  Migraine headaches.  Cluster headaches.  Chronic daily headaches. HOME CARE INSTRUCTIONS  Watch your condition for any changes. Take these steps to help with your condition: Managing Pain  Take over-the-counter and prescription medicines only as told by your health care provider.  Lie down in a dark, quiet room when you have a headache.  If directed, apply ice to the head and neck area:  Put ice in a plastic bag.  Place a towel between your skin and the bag.  Leave the ice on for 20 minutes, 2-3 times per day.  Use a heating pad or hot shower to apply heat to the head and neck area as told by your health care provider.  Keep lights dim if bright lights bother you or make your headaches worse. Eating and Drinking  Eat meals on a regular schedule.  Limit alcohol use.  Decrease the amount of caffeine you drink, or stop drinking caffeine. General Instructions  Keep all follow-up visits as told by your health care provider. This is important.  Keep a headache journal to help find out what may trigger your headaches. For example, write down:  What you eat and drink.  How much sleep you get.  Any change to your diet or medicines.  Try massage or other relaxation techniques.  Limit stress.  Sit up straight, and do not tense your muscles.  Do not use tobacco products, including cigarettes, chewing tobacco, or e-cigarettes. If you need help quitting, ask your health care provider.  Exercise regularly as told by your health care provider.  Sleep on a regular schedule. Get 7-9 hours of sleep, or the amount recommended by your health care provider. SEEK MEDICAL CARE IF:   Your  symptoms are not helped by medicine.  You have a headache that is different from the usual headache.  You have nausea or you vomit.  You have a fever. SEEK IMMEDIATE MEDICAL CARE IF:   Your headache becomes severe.  You have repeated vomiting.  You have a stiff neck.  You have a loss of vision.  You have problems with speech.  You have pain in the eye or ear.  You have muscular weakness or loss of muscle control.  You lose your balance or have trouble walking.  You feel faint or pass out.  You have confusion.   This information is not intended to replace advice given to you by your health care provider. Make sure you discuss any questions you have with your health care provider.   Document Released: 02/06/2005 Document Revised: 10/28/2014 Document Reviewed: 06/01/2014 Elsevier Interactive Patient Education 2016 [employer]  Migraine Headache A migraine headache is an intense, throbbing pain on one or both sides of your head. A migraine can last for 30 minutes to several hours. CAUSES  The exact cause of a migraine headache is not always known. However, a migraine may be caused when nerves in the brain become irritated and release chemicals that cause inflammation. This causes pain. Certain things may also trigger migraines, such as:  Alcohol.  Smoking.  Stress.  Menstruation.  Aged cheeses.  Foods or drinks that contain nitrates, glutamate, aspartame, or tyramine.  Lack  of sleep.  Chocolate.  Caffeine.  Hunger.  Physical exertion.  Fatigue.  Medicines used to treat chest pain (nitroglycerine), birth control pills, estrogen, and some blood pressure medicines. SIGNS AND SYMPTOMS  Pain on one or both sides of your head.  Pulsating or throbbing pain.  Severe pain that prevents daily activities.  Pain that is aggravated by any physical activity.  Nausea, vomiting, or both.  Dizziness.  Pain with exposure to bright lights, loud noises, or  activity.  General sensitivity to bright lights, loud noises, or smells. Before you get a migraine, you may get warning signs that a migraine is coming (aura). An aura may include:  Seeing flashing lights.  Seeing bright spots, halos, or zigzag lines.  Having tunnel vision or blurred vision.  Having feelings of numbness or tingling.  Having trouble talking.  Having muscle weakness. DIAGNOSIS  A migraine headache is often diagnosed based on:  Symptoms.  Physical exam.  A CT scan or MRI of your head. These imaging tests cannot diagnose migraines, but they can help rule out other causes of headaches. TREATMENT Medicines may be given for pain and nausea. Medicines can also be given to help prevent recurrent migraines.  HOME CARE INSTRUCTIONS  Only take over-the-counter or prescription medicines for pain or discomfort as directed by your health care provider. The use of long-term narcotics is not recommended.  Lie down in a dark, quiet room when you have a migraine.  Keep a journal to find out what may trigger your migraine headaches. For example, write down:  What you eat and drink.  How much sleep you get.  Any change to your diet or medicines.  Limit alcohol consumption.  Quit smoking if you smoke.  Get 7-9 hours of sleep, or as recommended by your health care provider.  Limit stress.  Keep lights dim if bright lights bother you and make your migraines worse. SEEK IMMEDIATE MEDICAL CARE IF:   Your migraine becomes severe.  You have a fever.  You have a stiff neck.  You have vision loss.  You have muscular weakness or loss of muscle control.  You start losing your balance or have trouble walking.  You feel faint or pass out.  You have severe symptoms that are different from your first symptoms. MAKE SURE YOU:   Understand these instructions.  Will watch your condition.  Will get help right away if you are not doing well or get worse.   This  information is not intended to replace advice given to you by your health care provider. Make sure you discuss any questions you have with your health care provider.   Document Released: 02/06/2005 Document Revised: 02/27/2014 Document Reviewed: 10/14/2012 Elsevier Interactive Patient Education [employer]

## 2015-05-10 NOTE — ED Provider Notes (Signed)
CSN: [id number]     Arrival date & time 05/10/15  0041 History  By signing my name below, I, Marisue HumbleMichelle Chaffee, attest that this documentation has been prepared under the direction and in the presence of Geoffery Lyonsouglas Marinda Tyer, MD . Electronically Signed: Marisue HumbleMichelle Chaffee, Scribe. 05/10/2015. 2:43 AM.    Chief Complaint  Patient presents with  . Chest Pain  . Migraine   The history is provided by the patient. No language interpreter was used.   HPI Comments:  Dallas SchimkeLisa Jemmott is a 30 y.o. female with PMHx of migraines who presents to the Emergency Department complaining of intermittent, mild "poking" left side chest pain yesterday while at work. Pt reports associated migraine yesterday; she reports headache is currently mild. Pt reports associated photophobia and one episode of emesis. She took Tylenol for migraine which exacerbated chest pain. Pt reports stress and anxiety from job. Pt was evaluated for chest pain in ED ~1 month ago and was dx with heart murmur. Pt states her father had heart problems. She denies h/o smoking, but notes secondary exposure. Pt denies current nausea.  Past Medical History  Diagnosis Date  . B12 deficiency   . Short bowel syndrome   . Migraines   . Heart murmur    History reviewed. No pertinent past surgical history. Family History  Problem Relation Age of Onset  . Hypertension Father   . Heart attack Father 857    Three separate MIs   Social History  Substance Use Topics  . Smoking status: Never Smoker   . Smokeless tobacco: None  . Alcohol Use: No   OB History    Gravida Para Term Preterm AB TAB SAB Ectopic Multiple Living   0 0 0 0 0 0 0 0       Review of Systems  Eyes: Positive for visual disturbance.  Cardiovascular: Positive for chest pain.  Gastrointestinal: Positive for vomiting. Negative for nausea.  Neurological: Positive for headaches.  All other systems reviewed and are negative.  Allergies  Ibuprofen and Pollen extract  Home Medications    Prior to Admission medications   Medication Sig Start Date End Date Taking? Authorizing Provider  acetaminophen (TYLENOL) 500 MG tablet Take 1,500 mg by mouth once.    Historical Provider, MD  butalbital-acetaminophen-caffeine (FIORICET) [phone] MG tablet Take 1-2 tablets by mouth every 6 (six) hours as needed for headache. 05/01/15 04/30/16  Lorre NickAnthony Allen, MD  ipratropium (ATROVENT) 0.06 % nasal spray Place 2 sprays into both nostrils 4 (four) times daily. Patient not taking: Reported on 05/01/2015 11/23/14   Linna HoffJames D Kindl, MD   BP 98/71 mmHg  Pulse 67  Temp(Src) 98.2 F (36.8 C) (Oral)  Resp 13  SpO2 100%  LMP 04/11/2015 (Approximate) Physical Exam  Constitutional: She is oriented to person, place, and time. She appears well-developed and well-nourished. No distress.  HENT:  Head: Normocephalic and atraumatic.  Mouth/Throat: Oropharynx is clear and moist. No oropharyngeal exudate.  Eyes: Conjunctivae and EOM are normal. Pupils are equal, round, and reactive to light. Right eye exhibits no discharge. Left eye exhibits no discharge. No scleral icterus.  Neck: Normal range of motion. Neck supple. No JVD present. No thyromegaly present.  Cardiovascular: Normal rate, regular rhythm, normal heart sounds and intact distal pulses.  Exam reveals no gallop and no friction rub.   No murmur heard. Pulmonary/Chest: Effort normal and breath sounds normal. No respiratory distress. She has no wheezes. She has no rales.  Abdominal: Soft. Bowel sounds are normal. She exhibits  no distension and no mass. There is no tenderness.  Musculoskeletal: Normal range of motion. She exhibits no edema or tenderness.  Lymphadenopathy:    She has no cervical adenopathy.  Neurological: She is alert and oriented to person, place, and time. No cranial nerve deficit. She exhibits normal muscle tone. Coordination normal.  Skin: Skin is warm and dry. No rash noted. No erythema.  Psychiatric: She has a normal mood and  affect. Her behavior is normal.  Nursing note and vitals reviewed.   ED Course  Procedures  DIAGNOSTIC STUDIES:  Oxygen Saturation is 100% on RA, normal by my interpretation.    COORDINATION OF CARE:  2:36 AM Will evaluate lab work when it returns. Discussed treatment plan with pt at bedside and pt agreed to plan.  Labs Review Labs Reviewed  CBC - Abnormal; Notable for the following:    WBC 12.2 (*)    All other components within normal limits  BASIC METABOLIC PANEL  I-STAT TROPOININ, ED  I-STAT BETA HCG BLOOD, ED ([facility], [facility], [facility] ONLY)    Imaging Review Dg Chest 2 View  05/10/2015  CLINICAL DATA:  Chest pain.  Worsening since 22:00. EXAM: CHEST  2 VIEW COMPARISON:  None. FINDINGS: The heart size and mediastinal contours are within normal limits. Both lungs are clear. The visualized skeletal structures are unremarkable. IMPRESSION: No active cardiopulmonary disease. Electronically Signed   By: Ellery Plunk M.D.   On: 05/10/2015 01:44   I have personally reviewed and evaluated these images and lab results as part of my medical decision-making.   EKG Interpretation   Date/Time:  Monday May 10 2015 00:47:24 EDT Ventricular Rate:  75 PR Interval:  140 QRS Duration: 73 QT Interval:  355 QTC Calculation: 396 R Axis:   80 Text Interpretation:  Sinus rhythm Baseline wander in lead(s) V1 Normal  ECG Confirmed by Lachlyn Vanderstelt  MD, Chane Magner ([id number]) on 05/10/2015 2:59:19 AM      MDM   Final diagnoses:  None    Patient presents with complaints of migraine headache that started while at work. Her neurologic exam is nonfocal and I see no reason for imaging. While she was in the emergency department she developed chest discomfort which sounds very atypical for cardiac pain. Her EKG is normal and troponin is negative. She is feeling somewhat better at the time of discharge. She is to take ibuprofen as needed for her pain. To return as needed for any problems.  I personally performed the  services described in this documentation, which was scribed in my presence. The recorded information has been reviewed and is accurate.       Geoffery Lyons, MD 05/10/15 [phone]

## 2015-05-10 NOTE — ED Notes (Signed)
Patient transported to X-ray 

## 2015-05-10 NOTE — ED Notes (Signed)
Patient d/c'd self care.  F/U reviewed with patient.  Patient verbalized understanding. 

## 2015-05-10 NOTE — ED Notes (Addendum)
Patient c/o chest pain and migraine. Patient took tylenol at 2200, states her chest pain worsened at that time. Patient states her chest pain was left sided, has had this happen before @1  month ago, evaluated and was told it was likely stress. Patient was also told she had a slight heart murmur at that time and encouraged to return if this happened again. 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 Patient also c/o left arm numbness, and bilateral leg pain. Patient reports family hx of heart attack.

## 2015-05-10 NOTE — ED Notes (Signed)
MD at bedside. 

## 2015-07-26 ENCOUNTER — Encounter (HOSPITAL_COMMUNITY): Payer: Self-pay | Admitting: Emergency Medicine

## 2015-07-26 ENCOUNTER — Emergency Department (HOSPITAL_COMMUNITY)
Admission: EM | Admit: 2015-07-26 | Discharge: 2015-07-27 | Disposition: A | Discharge status: Home / Self Care | Payer: Self-pay | Attending: Emergency Medicine | Admitting: Emergency Medicine

## 2015-07-26 DIAGNOSIS — G43009 Migraine without aura, not intractable, without status migrainosus: Secondary | ICD-10-CM | POA: Insufficient documentation

## 2015-07-26 NOTE — ED Notes (Signed)
Pt states she has had a headache since Saturday  Pt states yesterday she had weakness, fever, and dizziness  Pt states she took meds for her fever and rested  Pt states she felt better today so she went to work  Pt states at work she was feeling light headed and sluggish so she was made by her work to come here for evaluation  Pt states she has some nausea  Pt laughing and joking in triage

## 2015-07-27 MED ORDER — BUTALBITAL-APAP-CAFFEINE 50-325-40 MG PO TABS: 1.0000 | ORAL_TABLET | Freq: Four times a day (QID) | ORAL | Status: AC | PRN

## 2015-07-27 NOTE — Discharge Instructions (Signed)
Recurrent Migraine Headache A migraine headache is an intense, throbbing pain on one or both sides of your head. Recurrent migraines keep coming back. A migraine can last for 30 minutes to several hours. CAUSES  The exact cause of a migraine headache is not always known. However, a migraine may be caused when nerves in the brain become irritated and release chemicals that cause inflammation. This causes pain. Certain things may also trigger migraines, such as:   Alcohol.  Smoking.  Stress.  Menstruation.  Aged cheeses.  Foods or drinks that contain nitrates, glutamate, aspartame, or tyramine.  Lack of sleep.  Chocolate.  Caffeine.  Hunger.  Physical exertion.  Fatigue.  Medicines used to treat chest pain (nitroglycerine), birth control pills, estrogen, and some blood pressure medicines. SYMPTOMS   Pain on one or both sides of your head.  Pulsating or throbbing pain.  Severe pain that prevents daily activities.  Pain that is aggravated by any physical activity.  Nausea, vomiting, or both.  Dizziness.  Pain with exposure to bright lights, loud noises, or activity.  General sensitivity to bright lights, loud noises, or smells. Before you get a migraine, you may get warning signs that a migraine is coming (aura). An aura may include:  Seeing flashing lights.  Seeing bright spots, halos, or zigzag lines.  Having tunnel vision or blurred vision.  Having feelings of numbness or tingling.  Having trouble talking.  Having muscle weakness. DIAGNOSIS  A recurrent migraine headache is often diagnosed based on:  Symptoms.  Physical examination.  A CT scan or MRI of your head. These imaging tests cannot diagnose migraines but can help rule out other causes of headaches.  TREATMENT  Medicines may be given for pain and nausea. Medicines can also be given to help prevent recurrent migraines. HOME CARE INSTRUCTIONS  Only take over-the-counter or prescription  medicines for pain or discomfort as directed by your health care provider. The use of long-term narcotics is not recommended.  Lie down in a dark, quiet room when you have a migraine.  Keep a journal to find out what may trigger your migraine headaches. For example, write down:  What you eat and drink.  How much sleep you get.  Any change to your diet or medicines.  Limit alcohol consumption.  Quit smoking if you smoke.  Get 7-9 hours of sleep, or as recommended by your health care provider.  Limit stress.  Keep lights dim if bright lights bother you and make your migraines worse. SEEK MEDICAL CARE IF:   You do not get relief from the medicines given to you.  You have a recurrence of pain.  You have a fever. SEEK IMMEDIATE MEDICAL CARE IF:  Your migraine becomes severe.  You have a stiff neck.  You have loss of vision.  You have muscular weakness or loss of muscle control.  You start losing your balance or have trouble walking.  You feel faint or pass out.  You have severe symptoms that are different from your first symptoms. MAKE SURE YOU:   Understand these instructions.  Will watch your condition.  Will get help right away if you are not doing well or get worse.   This information is not intended to replace advice given to you by your health care provider. Make sure you discuss any questions you have with your health care provider.   Document Released: 11/01/2000 Document Revised: 02/27/2014 Document Reviewed: 10/14/2012 Elsevier Interactive Patient Education 2016 [employer]  

## 2015-07-27 NOTE — ED Provider Notes (Signed)
CSN: [id number]     Arrival date & time 07/26/15  2040 History   None    Chief Complaint  Patient presents with  . Headache     (Consider location/radiation/quality/duration/timing/severity/associated sxs/prior Treatment) HPI Comments: Patient with a history of migraine presents with headache persistent over the last several days. She has been taking tylenol and resting with minimal relief. She reports nausea without vomiting. She states current headache typical of migraine headache history. No visual change.   Patient is a 30 y.o. female presenting with migraines. The history is provided by the patient. No language interpreter was used.  Migraine This is a recurrent problem. The current episode started in the past 7 days. The problem occurs constantly. The problem has been waxing and waning. Associated symptoms include headaches and nausea. Pertinent negatives include no chest pain, congestion, fever, myalgias or vomiting.    Past Medical History  Diagnosis Date  . B12 deficiency   . Short bowel syndrome   . Migraines   . Heart murmur    Past Surgical History  Procedure Laterality Date  . Abdominal surgery     Family History  Problem Relation Age of Onset  . Hypertension Father   . Heart attack Father [phone]    Three separate MIs   Social History  Substance Use Topics  . Smoking status: Never Smoker   . Smokeless tobacco: None  . Alcohol Use: No   OB History    Gravida Para Term Preterm AB TAB SAB Ectopic Multiple Living   0 0 0 0 0 0 0 0       Review of Systems  Constitutional: Negative for fever.  HENT: Negative for congestion and sinus pressure.   Eyes: Negative for photophobia and visual disturbance.  Respiratory: Negative for shortness of breath.   Cardiovascular: Negative for chest pain.  Gastrointestinal: Positive for nausea. Negative for vomiting.  Musculoskeletal: Negative for myalgias.  Skin: Negative.   Neurological: Positive for headaches.       Allergies  Ibuprofen; Pollen extract; and Benadryl  Home Medications   Prior to Admission medications   Medication Sig Start Date End Date Taking? Authorizing Provider  acetaminophen (TYLENOL) 500 MG tablet Take 1,500 mg by mouth once.   Yes Historical Provider, MD  butalbital-acetaminophen-caffeine (FIORICET) [phone] MG tablet Take 1-2 tablets by mouth every 6 (six) hours as needed for headache. 05/01/15 04/30/16 Yes Lorre NickAnthony Allen, MD  ipratropium (ATROVENT) 0.06 % nasal spray Place 2 sprays into both nostrils 4 (four) times daily. Patient not taking: Reported on 05/01/2015 11/23/14   Linna HoffJames D Kindl, MD   BP 118/82 mmHg  Pulse 64  Temp(Src) 97.9 F (36.6 C) (Oral)  Resp 16  Ht 5' (1.524 m)  Wt 49.499 kg  BMI 21.31 kg/m2  SpO2 99%  LMP 07/16/2015 (Approximate) Physical Exam  Constitutional: She is oriented to person, place, and time. She appears well-developed and well-nourished.  HENT:  Head: Normocephalic.  Eyes: Pupils are equal, round, and reactive to light.  Neck: Normal range of motion. Neck supple.  Cardiovascular: Normal rate.   Pulmonary/Chest: Effort normal.  Abdominal: Soft. Bowel sounds are normal. There is no tenderness. There is no rebound and no guarding.  Musculoskeletal: Normal range of motion.  Neurological: She is alert and oriented to person, place, and time. She has normal strength and normal reflexes. No sensory deficit. She displays a negative Romberg sign. Coordination normal.  CN's 3-12 grossly intact. Speech clear and focused. No deficits of coordination. Ambulatory without  imbalance.   Skin: Skin is warm and dry. No rash noted.  Psychiatric: She has a normal mood and affect.    ED Course  Procedures (including critical care time) Labs Review Labs Reviewed - No data to display  Imaging Review No results found. I have personally reviewed and evaluated these images and lab results as part of my medical decision-making.   EKG  Interpretation None      MDM   Final diagnoses:  None    1, migraine  Patient presents with headache typical of her migraine history. She is neurologically intact without focal deficits. She reports she is here because "my work made me come in to the ER". She is felt stable for discharge home and is encouraged to follow up with a primary care as planned.     Elpidio Anis, PA-C 07/27/15 1610  Paula Libra, MD 07/27/15 [phone]

## 2015-08-14 ENCOUNTER — Emergency Department
Admission: EM | Admit: 2015-08-14 | Discharge: 2015-08-14 | Disposition: A | Discharge status: Home / Self Care | Payer: Self-pay | Attending: Emergency Medicine | Admitting: Emergency Medicine

## 2015-08-14 ENCOUNTER — Emergency Department: Payer: Self-pay

## 2015-08-14 ENCOUNTER — Encounter: Payer: Self-pay | Admitting: *Deleted

## 2015-08-14 DIAGNOSIS — Z8669 Personal history of other diseases of the nervous system and sense organs: Secondary | ICD-10-CM

## 2015-08-14 DIAGNOSIS — G43909 Migraine, unspecified, not intractable, without status migrainosus: Secondary | ICD-10-CM | POA: Insufficient documentation

## 2015-08-14 LAB — POCT PREGNANCY, URINE: Preg Test, Ur: NEGATIVE

## 2015-08-14 MED ORDER — METOCLOPRAMIDE HCL 5 MG/ML IJ SOLN
10.0000 mg | Freq: Once | INTRAMUSCULAR | Status: AC
  Administered 2015-08-14: 10 mg via INTRAVENOUS
  Filled 2015-08-14: qty 2

## 2015-08-14 MED ORDER — KETOROLAC TROMETHAMINE 30 MG/ML IJ SOLN
30.0000 mg | Freq: Once | INTRAMUSCULAR | Status: AC
  Administered 2015-08-14: 30 mg via INTRAVENOUS
  Filled 2015-08-14: qty 1

## 2015-08-14 MED ORDER — METOCLOPRAMIDE HCL 5 MG/ML IJ SOLN: 5.0000 mg | Freq: Once | INTRAMUSCULAR | Status: DC

## 2015-08-14 MED ORDER — SUMATRIPTAN SUCCINATE 100 MG PO TABS: 100.0000 mg | ORAL_TABLET | Freq: Once | ORAL | Status: AC | PRN

## 2015-08-14 NOTE — ED Provider Notes (Signed)
[facility] Emergency Department Provider Note  ____________________________________________  Time seen: Approximately 11:25 AM  I have reviewed the triage vital signs and the nursing notes.   HISTORY  Chief Complaint Migraine    HPI Judy SchimkeLisa Fisher is a 30 y.o. female patient with a history of migraine headaches presents today for evaluation with a continuous headache over last few days. 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 She has been taking Fioricet with no relief at this time. Rates her pain as 11/10. This particular episode is lasting about the last 2 or 3 days and had to leave work earlier. Patient feels like she has some nausea.   Past Medical History  Diagnosis Date  . B12 deficiency   . Short bowel syndrome   . Migraines   . Heart murmur     There are no active problems to display for this patient.   Past Surgical History  Procedure Laterality Date  . Abdominal surgery      Current Outpatient Rx  Name  Route  Sig  Dispense  Refill  . acetaminophen (TYLENOL) 500 MG tablet   Oral   Take 1,500 mg by mouth once.         . butalbital-acetaminophen-caffeine (FIORICET) 50-325-40 MG tablet   Oral   Take 1-2 tablets by mouth every 6 (six) hours as needed for headache.   8 tablet   0   . SUMAtriptan (IMITREX) 100 MG tablet   Oral   Take 1 tablet (100 mg total) by mouth once as needed for migraine. May repeat in 2 hours if headache persists or recurs.   15 tablet   0     Allergies Ibuprofen; Pollen extract; and Benadryl  Family History  Problem Relation Age of Onset  . Hypertension Father   . Heart attack Father 757    Three separate MIs    Social History Social History  Substance Use Topics  . Smoking status: Never Smoker   . Smokeless tobacco: None  . Alcohol Use: No    Review of Systems Constitutional: No  fever/chills Eyes: Positive visual changes, blurry vision at times.. ENT: No sore throat. Cardiovascular: Denies chest pain. Respiratory: Denies shortness of breath. Gastrointestinal: No abdominal pain.  Positive nausea, no vomiting.  No diarrhea.  No constipation. Genitourinary: Negative for dysuria. Musculoskeletal: Negative for back pain. Skin: Negative for rash. Neurological: Positive for headaches, negative for focal weakness or numbness.  10-point ROS otherwise negative.  ____________________________________________   PHYSICAL EXAM:  VITAL SIGNS: ED Triage Vitals  Enc Vitals Group     BP 08/14/15 1054 103/77 mmHg     Pulse Rate 08/14/15 1054 77     Resp 08/14/15 1054 18     Temp 08/14/15 1054 98 F (36.7 C)     Temp Source 08/14/15 1054 Oral     SpO2 08/14/15 1054 99 %     Weight 08/14/15 1054 109 lb (49.442 kg)     Height 08/14/15 1054 5\' 1"  (1.549 m)     Head Cir --      Peak Flow --      Pain Score 08/14/15 1055 10     Pain Loc --      Pain Edu? --      Excl. in GC? --     Constitutional: Alert and oriented. Well appearing and in no acute distress. Eyes: Conjunctivae are normal.  PERRL. EOMI.Funduscopy unremarkable. Head: Atraumatic. Nose: No congestion/rhinnorhea. Mouth/Throat: Mucous membranes are moist.  Oropharynx non-erythematous. Neck: No stridor. Full range of motion nontender. Supple. Cardiovascular: Normal rate, regular rhythm. Grossly normal heart sounds.  Good peripheral circulation. Respiratory: Normal respiratory effort.  No retractions. Lungs CTAB. Gastrointestinal: Soft and nontender. No distention. No abdominal bruits. No CVA tenderness. Musculoskeletal: No lower extremity tenderness nor edema.  No joint effusions. Neurologic:  Normal speech and language. No gross focal neurologic deficits are appreciated. No gait instability. A and O 3. Cranial nerves II through XII grossly intact. Skin:  Skin is warm, dry and intact. No rash  noted. Psychiatric: Mood and affect are normal. Speech and behavior are normal.  ____________________________________________   LABS (all labs ordered are listed, but only abnormal results are displayed)  Labs Reviewed  POCT PREGNANCY, URINE   ____________________________________________  EKG   ____________________________________________  RADIOLOGY  Head CT unremarkable for any acute interosseous findings.  FINDINGS: The brain demonstrates no evidence of hemorrhage, infarction, edema, mass effect, extra-axial fluid collection, hydrocephalus or mass lesion. The skull is unremarkable.  IMPRESSION: Normal head CT.  ____________________________________________    Procedure(s) performed: None  Critical Care performed: No  ____________________________________________   INITIAL IMPRESSION / ASSESSMENT AND PLAN / ED COURSE  Pertinent labs & imaging results that were available during my care of the patient were reviewed by me and considered in my medical decision making (see chart for details).  Migraine headache history is similar to previous. Rx given for Imitrex 100 mg at the onset of a headache. Patient is follow-up with PCP referral given to Phineas Realharles Drew or Lulu RidingScott Moody for referral to neurology.  ____________________________________________   FINAL CLINICAL IMPRESSION(S) / ED DIAGNOSES  Final diagnoses:  History of migraine headaches     This chart was dictated using voice recognition software/Dragon. Despite best efforts to proofread, errors can occur which can change the meaning. Any change was purely unintentional.   Evangeline Dakinharles M Izora Benn, PA-C 08/14/15 1448  Governor Rooksebecca Lord, MD 08/14/15 [phone]

## 2015-08-14 NOTE — ED Notes (Signed)
Pt arrives with complaints of migraines for the past several days, was given Fioricet but states no relief, states hx of migraines, pt has sunglasses on in triage, states blurry vision and nausea, awake and alert in no acute distress

## 2015-08-14 NOTE — ED Notes (Signed)
Reports migraine, with light sensitivity for 2 days. Has been taking Fioricet without relief.

## 2015-08-14 NOTE — ED Notes (Signed)
Patient returned from CT

## 2015-08-22 ENCOUNTER — Encounter (HOSPITAL_COMMUNITY): Payer: Self-pay | Admitting: Emergency Medicine

## 2015-08-22 ENCOUNTER — Emergency Department (HOSPITAL_COMMUNITY)
Admission: EM | Admit: 2015-08-22 | Discharge: 2015-08-22 | Disposition: A | Discharge status: Home / Self Care | Payer: Self-pay | Attending: Emergency Medicine | Admitting: Emergency Medicine

## 2015-08-22 DIAGNOSIS — G43809 Other migraine, not intractable, without status migrainosus: Secondary | ICD-10-CM | POA: Insufficient documentation

## 2015-08-22 DIAGNOSIS — R531 Weakness: Secondary | ICD-10-CM | POA: Insufficient documentation

## 2015-08-22 NOTE — ED Provider Notes (Signed)
CSN: [id number]     Arrival date & time 08/22/15  1933 History  By signing my name below, I, Levon HedgerElizabeth Hall, attest that this documentation has been prepared under the direction and in the presence of non-physician practitioner, Dorthula Matasiffany G Princetta Uplinger, PA-C Electronically Signed: Levon HedgerElizabeth Hall, Scribe. 08/22/2015. 8:31 PM.    Chief Complaint  Patient presents with  . Blurred Vision   The history is provided by the patient. No language interpreter was used.    HPI Comments:  Judy SchimkeLisa Fisher is a 30 y.o. female with PMHx of migraines who presents to the Emergency Department complaining of sudden onset, gradually improving blurry vision onset this morning. She also complains of associated shakiness, vomiting 1x, and generalized weakness. She states she normally experiences blurry vision when she has migraines, but the shakiness is usually when she gets sick. Her colleagues at work have been sick and she believes she is catching th same thing.  She was seen at [facility] for a migraine earlier this week. Per pt, she was given a CT which was normal and will follow up with a neurologist when she has insurance. Pt states that she has had sick contact at work and is concerned that she caught something at work. Pt reports that she had a migraine today and took dose of Fiorcet today before her migraine came on and it relieved all of her symptoms. She also has a rx for Imitrex she is hoping to get filled. She believes her migraines are due to the lights/computer screen at work.  Pt denies any cough or shortness of breath. No alleviating factors noted.    She reports not wanting to come to the ER and thinking it unnecessary but her job has required her to come visit and to get a work note.  Past Medical History  Diagnosis Date  . B12 deficiency   . Short bowel syndrome   . Migraines   . Heart murmur    Past Surgical History  Procedure Laterality Date  . Abdominal surgery     Family History  Problem  Relation Age of Onset  . Hypertension Father   . Heart attack Father 6757    Three separate MIs   Social History  Substance Use Topics  . Smoking status: Never Smoker   . Smokeless tobacco: None  . Alcohol Use: No   OB History    Gravida Para Term Preterm AB TAB SAB Ectopic Multiple Living   0 0 0 0 0 0 0 0       Review of Systems  Eyes: Positive for visual disturbance.  Respiratory: Negative for cough and shortness of breath.   Neurological: Positive for weakness and headaches.  All other systems reviewed and are negative.     Allergies  Ibuprofen; Pollen extract; and Benadryl  Home Medications   Prior to Admission medications   Medication Sig Start Date End Date Taking? Authorizing Provider  acetaminophen (TYLENOL) 500 MG tablet Take 1,500 mg by mouth once.    Historical Provider, MD  butalbital-acetaminophen-caffeine (FIORICET) 50-325-40 MG tablet Take 1-2 tablets by mouth every 6 (six) hours as needed for headache. 07/27/15 07/26/16  Elpidio AnisShari Upstill, PA-C  SUMAtriptan (IMITREX) 100 MG tablet Take 1 tablet (100 mg total) by mouth once as needed for migraine. May repeat in 2 hours if headache persists or recurs. 08/14/15 08/13/16  Charmayne Sheerharles M Beers, PA-C   BP 115/84 mmHg  Pulse 87  Temp(Src) 98.1 F (36.7 C) (Oral)  Resp 18  Ht  5\' 1"  (1.549 m)  Wt 46.267 kg  BMI 19.28 kg/m2  SpO2 97%  LMP 07/16/2015 (Approximate) Physical Exam  Constitutional: She is oriented to person, place, and time. She appears well-developed and well-nourished. No distress.  HENT:  Head: Normocephalic and atraumatic.  Eyes: Conjunctivae are normal.  Cardiovascular: Normal rate.   Pulmonary/Chest: Effort normal.  Abdominal: She exhibits no distension.  Neurological: She is alert and oriented to person, place, and time.  Cranial nerves grossly intact on exam. Pt alert and oriented x 3 Upper and lower extremity strength is symmetrical and physiologic Normal muscular tone No facial  droop Coordination intact, no limb ataxia   Skin: Skin is warm and dry.  Psychiatric: She has a normal mood and affect.  Nursing note and vitals reviewed.   ED Course  Procedures  DIAGNOSTIC STUDIES:  Oxygen Saturation is 97% on RA, normal by my interpretation.    COORDINATION OF CARE:  8:30 PM Discussed treatment plan with pt at bedside and pt agreed to plan.  Labs Review Labs Reviewed - No data to display  Imaging Review No results found. I have personally reviewed and evaluated these images and lab results as part of my medical decision-making.   EKG Interpretation None      MDM   Final diagnoses:  Other migraine without status migrainosus, not intractable   Pt is advised to stay hydrated and continue taking migraine medication as normal.  Follow-up with Neurology already referred. She is not currently having any symptoms as the Fiorcet relieved her symptoms.     Presentation is like pts typical HA and non concerning for [facility], ICH, Meningitis, or temporal arteritis. Pt is afebrile with no focal neuro deficits, nuchal rigidity, or change in vision. Pt is to follow up with PCP to discuss prophylactic medication. Pt verbalizes understanding and is agreeable with plan to dc.    I personally performed the services described in this documentation, which was scribed in my presence. The recorded information has been reviewed and is accurate.     Marlon Peliffany Vicci Reder, PA-C 08/22/15 2044  Linwood DibblesJon Knapp, MD 08/23/15 Ventura Bruns0030

## 2015-08-22 NOTE — Discharge Instructions (Signed)
Recurrent Migraine Headache A migraine headache is an intense, throbbing pain on one or both sides of your head. Recurrent migraines keep coming back. A migraine can last for 30 minutes to several hours. CAUSES  The exact cause of a migraine headache is not always known. However, a migraine may be caused when nerves in the brain become irritated and release chemicals that cause inflammation. This causes pain. Certain things may also trigger migraines, such as:   Alcohol.  Smoking.  Stress.  Menstruation.  Aged cheeses.  Foods or drinks that contain nitrates, glutamate, aspartame, or tyramine.  Lack of sleep.  Chocolate.  Caffeine.  Hunger.  Physical exertion.  Fatigue.  Medicines used to treat chest pain (nitroglycerine), birth control pills, estrogen, and some blood pressure medicines. SYMPTOMS   Pain on one or both sides of your head.  Pulsating or throbbing pain.  Severe pain that prevents daily activities.  Pain that is aggravated by any physical activity.  Nausea, vomiting, or both.  Dizziness.  Pain with exposure to bright lights, loud noises, or activity.  General sensitivity to bright lights, loud noises, or smells. Before you get a migraine, you may get warning signs that a migraine is coming (aura). An aura may include:  Seeing flashing lights.  Seeing bright spots, halos, or zigzag lines.  Having tunnel vision or blurred vision.  Having feelings of numbness or tingling.  Having trouble talking.  Having muscle weakness. DIAGNOSIS  A recurrent migraine headache is often diagnosed based on:  Symptoms.  Physical examination.  A CT scan or MRI of your head. These imaging tests cannot diagnose migraines but can help rule out other causes of headaches.  TREATMENT  Medicines may be given for pain and nausea. Medicines can also be given to help prevent recurrent migraines. HOME CARE INSTRUCTIONS  Only take over-the-counter or prescription  medicines for pain or discomfort as directed by your health care provider. The use of long-term narcotics is not recommended.  Lie down in a dark, quiet room when you have a migraine.  Keep a journal to find out what may trigger your migraine headaches. For example, write down:  What you eat and drink.  How much sleep you get.  Any change to your diet or medicines.  Limit alcohol consumption.  Quit smoking if you smoke.  Get 7-9 hours of sleep, or as recommended by your health care provider.  Limit stress.  Keep lights dim if bright lights bother you and make your migraines worse. SEEK MEDICAL CARE IF:   You do not get relief from the medicines given to you.  You have a recurrence of pain.  You have a fever. SEEK IMMEDIATE MEDICAL CARE IF:  Your migraine becomes severe.  You have a stiff neck.  You have loss of vision.  You have muscular weakness or loss of muscle control.  You start losing your balance or have trouble walking.  You feel faint or pass out.  You have severe symptoms that are different from your first symptoms. MAKE SURE YOU:   Understand these instructions.  Will watch your condition.  Will get help right away if you are not doing well or get worse.   This information is not intended to replace advice given to you by your health care provider. Make sure you discuss any questions you have with your health care provider.   Document Released: 11/01/2000 Document Revised: 02/27/2014 Document Reviewed: 10/14/2012 Elsevier Interactive Patient Education 2016 [employer]  

## 2015-08-22 NOTE — ED Notes (Signed)
Pt states she is feeling shaky and has blurred vision to the point that she cannot drive herself  Pt states she has a hx of migraine headaches and has a mild headache now  Pt states she called out of work today and they need a work note  Pt states she has been around sick people lately

## 2015-09-03 ENCOUNTER — Encounter (HOSPITAL_COMMUNITY): Payer: Self-pay | Admitting: Oncology

## 2015-09-03 ENCOUNTER — Emergency Department (HOSPITAL_COMMUNITY)
Admission: EM | Admit: 2015-09-03 | Discharge: 2015-09-04 | Disposition: A | Discharge status: Home / Self Care | Payer: Self-pay | Attending: Emergency Medicine | Admitting: Emergency Medicine

## 2015-09-03 DIAGNOSIS — Z79899 Other long term (current) drug therapy: Secondary | ICD-10-CM | POA: Insufficient documentation

## 2015-09-03 DIAGNOSIS — G43009 Migraine without aura, not intractable, without status migrainosus: Secondary | ICD-10-CM

## 2015-09-03 DIAGNOSIS — G43909 Migraine, unspecified, not intractable, without status migrainosus: Secondary | ICD-10-CM | POA: Insufficient documentation

## 2015-09-03 NOTE — ED Notes (Signed)
Pt w/ hx of migraine headaches.  States she was on her way to work when she developed blurred vision prompting her to come here.  Per pt she states that she typically gets blurred vision however has never happened while driving.

## 2015-09-04 MED ORDER — DEXAMETHASONE SODIUM PHOSPHATE 10 MG/ML IJ SOLN
10.0000 mg | Freq: Once | INTRAMUSCULAR | Status: AC
  Administered 2015-09-04: 10 mg via INTRAMUSCULAR
  Filled 2015-09-04: qty 1

## 2015-09-04 MED ORDER — METOCLOPRAMIDE HCL 5 MG/ML IJ SOLN
10.0000 mg | Freq: Once | INTRAMUSCULAR | Status: AC
  Administered 2015-09-04: 10 mg via INTRAMUSCULAR
  Filled 2015-09-04: qty 2

## 2015-09-04 NOTE — Discharge Instructions (Signed)
Migraine Headache A migraine headache is an intense, throbbing pain on one or both sides of your head. A migraine can last for 30 minutes to several hours. CAUSES  The exact cause of a migraine headache is not always known. However, a migraine may be caused when nerves in the brain become irritated and release chemicals that cause inflammation. This causes pain. Certain things may also trigger migraines, such as:  Alcohol.  Smoking.  Stress.  Menstruation.  Aged cheeses.  Foods or drinks that contain nitrates, glutamate, aspartame, or tyramine.  Lack of sleep.  Chocolate.  Caffeine.  Hunger.  Physical exertion.  Fatigue.  Medicines used to treat chest pain (nitroglycerine), birth control pills, estrogen, and some blood pressure medicines. SIGNS AND SYMPTOMS  Pain on one or both sides of your head.  Pulsating or throbbing pain.  Severe pain that prevents daily activities.  Pain that is aggravated by any physical activity.  Nausea, vomiting, or both.  Dizziness.  Pain with exposure to bright lights, loud noises, or activity.  General sensitivity to bright lights, loud noises, or smells. Before you get a migraine, you may get warning signs that a migraine is coming (aura). An aura may include:  Seeing flashing lights.  Seeing bright spots, halos, or zigzag lines.  Having tunnel vision or blurred vision.  Having feelings of numbness or tingling.  Having trouble talking.  Having muscle weakness. DIAGNOSIS  A migraine headache is often diagnosed based on:  Symptoms.  Physical exam.  A CT scan or MRI of your head. These imaging tests cannot diagnose migraines, but they can help rule out other causes of headaches. TREATMENT Medicines may be given for pain and nausea. Medicines can also be given to help prevent recurrent migraines.  HOME CARE INSTRUCTIONS  Only take over-the-counter or prescription medicines for pain or discomfort as directed by your  health care provider. The use of long-term narcotics is not recommended.  Lie down in a dark, quiet room when you have a migraine.  Keep a journal to find out what may trigger your migraine headaches. For example, write down:  What you eat and drink.  How much sleep you get.  Any change to your diet or medicines.  Limit alcohol consumption.  Quit smoking if you smoke.  Get 7-9 hours of sleep, or as recommended by your health care provider.  Limit stress.  Keep lights dim if bright lights bother you and make your migraines worse. SEEK IMMEDIATE MEDICAL CARE IF:   Your migraine becomes severe.  You have a fever.  You have a stiff neck.  You have vision loss.  You have muscular weakness or loss of muscle control.  You start losing your balance or have trouble walking.  You feel faint or pass out.  You have severe symptoms that are different from your first symptoms. MAKE SURE YOU:   Understand these instructions.  Will watch your condition.  Will get help right away if you are not doing well or get worse.   This information is not intended to replace advice given to you by your health care provider. Make sure you discuss any questions you have with your health care provider.   Document Released: 02/06/2005 Document Revised: 02/27/2014 Document Reviewed: 10/14/2012 Elsevier Interactive Patient Education 2016 [employer]. [employer] Assistance The United Ways 211 is a great source of information about community services available.  Access by dialing 2-1-1 from anywhere in [place], or by website -  PooledIncome.pl.  Other Local Resources (Updated 02/2015)  Financial Assistance   Services    Phone Number and Address  [facility]  Low-cost medical care - 1st and 3rd Saturday of every month  Must not qualify for public or private insurance and must have limited income [phone] [address] [place],  [place]    [place] Department of Social Services  Child care  Emergency assistance for housing and Kimberly-Clarkutilities  Food stamps  Medicaid [phone] [address] [place], [place] [address]   [facility]  Low-cost medical care for children, communicable diseases, sexually-transmitted diseases, immunizations, maternity care, womens health and family planning [phone] [address] [place], [place] [address]  [facility]   Medication assistance for [facility] residents  Must meet income requirements [phone] [address] [place], [place].    [facility]  Child care  Emergency assistance for housing and Kimberly-Clarkutilities  Food stamps  Medicaid [phone] [address] [place], [place] [address]  [facility]   Low-cost medical care,   Monday through Friday, 9 am to 6 pm.   Accepts Medicare/Medicaid, and self-pay [phone] [address] [place], [place] [address]  [facility]  Low-cost medical care - Monday through Friday, 8:30 am - 5:30 pm  Accepts Medicaid and self-pay [phone] [address] [place], [place] [address]   [facility]  Primary medical care, including for those with sickle cell disease  Accepts Medicare, Medicaid, insurance and self-pay [phone] [address] [place], [place]  [facility]   Primary medical care  Accepts Medicare, [place], insurance and self-pay [phone] [address] [place], [place] [address]   [facility] Department of Social Services  Child care  Emergency assistance for housing and Kimberly-Clarkutilities  Food stamps  Medicaid [phone] [address] [place], [place] [address]  [facility] and [employer]Human Services  Child care  Emergency assistance for  housing and Kimberly-Clarkutilities  Food stamps  Medicaid [phone] [address] [place], [place] [address]   [facility]  Medication assistance for [facility] residents with no insurance only  Must have a primary care doctor [phone] [address] [place], [place]  [facility]   Primary medical care  Dover[place]s Medicare, [place], insurance  [phone] [address] [place], [place]  Med[facility]ist   Medication assistance [phone]  [facility]   Primary medical care  Accepts Medicare, [place], insurance and self-pay [phone] [address] [place], [place] [address]  [facility]   Primary medical care  Accepts Medicare, [place], insurance and self-pay [phone] [address] [place], [place] [address]  [facility] [place] residents between the ages of 618 and 464 who do not have any form of health insurance, Medicare, [place]icaid, or [place] benefits.  Services are provided free of charge to uninsured patients who fall within federal poverty guidelines.    Hours: Tuesdays and Thursdays, 4:15 - 8 pm [phone] [address], [address] [place], [place] [address]  [facility]     Primary medical care  Dental care  Nutritional counseling  Pharmacy  Accepts Medicaid, Medicare, most insurance.  Fees are adjusted based on ability to pay.   [phone] [facility] [address] [place], [place]  [phone] [phone] Leonette Mostharles  [facility] [address] [place], [place]  [phone] [facility] [place], [place]  [phone] [facility], [address] [place], [place]  [phone] [facility] [address] [place], [place]  [facility]  Womens health and family planning [phone] [address] [place], [place]  [facility] Department of Social Services  Child care  Emergency assistance for housing and [employer]  Medicaid [phone] [address], [place], [place] [address]   [facility]    Ages 30 and older  Hours: Mondays and Thursdays, 7:00 am - 9:00 am Patients are seen on a first come, first served basis. [phone], ext. [phone] [place], [place]  [facility] Division of Social Services  Child care  Emergency assistance for housing and [employer]  Medicaid [phone] [address], [place] [address]  [employer]  Medication assistance  Rental assistance  Food pantry  Medication assistance  Housing assistance  Emergency food distribution  Utility assistance [phone] [address] [place], [place]  [phone]  [address] [place], [place] [address] Hours: Tuesdays and Thursdays from 9am - 12 noon by appointment only  [phone] [address] [place], [place] [address]  [facility] Lanae Boast   Accepts private insurance, [place], and [place].  Payment is based on a sliding scale for those without insurance.  Hours: Mondays, Tuesdays and Thursdays, 8:30 am - 5:30 pm.   [phone] [address] [place], [place]  [facility] - Family Medicine at [facility], [place], and [place].  Payment is based on a sliding scale for those without insurance. [phone] [address] [place], [place]  [facility] - Pediatrics at E. Scientist, research (physical sciences), [employer], and [place].  Payment is based on a sliding scale for those without insurance [phone] [address], [employer], [place]  [facility] - Pediatrics at [employer], [place], and [place].  Payment is based on  a sliding scale for those without insurance. [phone] [address] [place], [place]  [facility] - Pediatrics at [facility], [place], and [place].  Payment is based on a sliding scale for those without insurance. [phone], [phone] [place], [place].    [facility]  Maternity care.  Accepts Medicaid and self-pay. [phone] [address] [place], [place]

## 2015-09-04 NOTE — ED Provider Notes (Signed)
CSN: [id number]     Arrival date & time 09/03/15  2110 History   By signing my name below, I, Suzan Slick. Elon Spanner, attest that this documentation has been prepared under the direction and in the presence of Rolan Bucco, MD.  Electronically Signed: Suzan Slick. Elon Spanner, ED Scribe. 09/04/2015. 12:24 AM.   Chief Complaint  Patient presents with  . Headache   The history is provided by the patient. No language interpreter was used.    HPI Comments: Judy Fisher is a 30 y.o. female with a PMHx of migraines who presents to the Emergency Department complaining of constant, unchanged HA x 1 day. Currently pain is rated 9/10. She reports that this is similar pain to prior headaches.  She also reports blurred vision while driving earlier this evening. No aggravating or alleviating factors at this time. Fioricet attempted at home without any improvement. Pt was previously prescribed Imitrex but could not fill medication due to cost. No fever, chills, nausea, or vomiting. Pt was recently evaluated on 7/12 for blurred vision. She was advised to hydrate, continue with medication, and follow up with a Neurologist. However, she is not able to follow up at this time as she does not have any insurance.   PCP: No PCP Per Patient    Past Medical History  Diagnosis Date  . B12 deficiency   . Short bowel syndrome   . Migraines   . Heart murmur    Past Surgical History  Procedure Laterality Date  . Abdominal surgery     Family History  Problem Relation Age of Onset  . Hypertension Father   . Heart attack Father 56    Three separate MIs   Social History  Substance Use Topics  . Smoking status: Never Smoker   . Smokeless tobacco: None  . Alcohol Use: No   OB History    Gravida Para Term Preterm AB TAB SAB Ectopic Multiple Living   0 0 0 0 0 0 0 0       Review of Systems  Constitutional: Negative for fever, chills, diaphoresis and fatigue.  HENT: Negative for congestion, rhinorrhea and sneezing.    Eyes: Positive for visual disturbance (Blurred vison).  Respiratory: Negative for cough, chest tightness and shortness of breath.   Cardiovascular: Negative for chest pain and leg swelling.  Gastrointestinal: Negative for nausea, vomiting, abdominal pain, diarrhea and blood in stool.  Genitourinary: Negative for frequency, hematuria, flank pain and difficulty urinating.  Musculoskeletal: Negative for back pain and arthralgias.  Skin: Negative for rash.  Neurological: Positive for headaches. Negative for dizziness, speech difficulty, weakness and numbness.      Allergies  Ibuprofen; Pollen extract; and Benadryl  Home Medications   Prior to Admission medications   Medication Sig Start Date End Date Taking? Authorizing Provider  acetaminophen (TYLENOL) 500 MG tablet Take 1,500 mg by mouth once.    Historical Provider, MD  butalbital-acetaminophen-caffeine (FIORICET) 50-325-40 MG tablet Take 1-2 tablets by mouth every 6 (six) hours as needed for headache. 07/27/15 07/26/16  Elpidio Anis, PA-C  SUMAtriptan (IMITREX) 100 MG tablet Take 1 tablet (100 mg total) by mouth once as needed for migraine. May repeat in 2 hours if headache persists or recurs. 08/14/15 08/13/16  Evangeline Dakin, PA-C   Triage Vitals: BP 113/76 mmHg  Pulse 88  Temp(Src) 98.7 F (37.1 C) (Oral)  Resp 18  Ht  (1.549 m)  Wt 102 lb (46.267 kg)  BMI 19.28 kg/m2  SpO2 100%  LMP  08/27/2015 (Approximate)   Physical Exam  Constitutional: She is oriented to person, place, and time. She appears well-developed and well-nourished.  HENT:  Head: Normocephalic and atraumatic.  Eyes: EOM are normal. Pupils are equal, round, and reactive to light.  Neck: Normal range of motion. Neck supple.  Cardiovascular: Normal rate, regular rhythm and normal heart sounds.   Pulmonary/Chest: Effort normal and breath sounds normal. No respiratory distress. She has no wheezes. She has no rales. She exhibits no tenderness.  Abdominal: Soft.  Bowel sounds are normal. She exhibits no distension. There is no tenderness. There is no rebound and no guarding.  Musculoskeletal: Normal range of motion. She exhibits no edema.  Lymphadenopathy:    She has no cervical adenopathy.  Neurological: She is alert and oriented to person, place, and time.  Motor 5 out of 5 all extremities, sensation grossly intact to light touch all extremities, cranial nerves II through XII grossly intact, finger-nose intact  Skin: Skin is warm and dry. No rash noted.  Psychiatric: She has a normal mood and affect.  Nursing note and vitals reviewed.   ED Course  Procedures (including critical care time)  DIAGNOSTIC STUDIES: Oxygen Saturation is 100% on RA, Normal by my interpretation.    COORDINATION OF CARE: 12:30 AM-Discussed treatment plan with pt at bedside and pt agreed to plan.     Labs Review Labs Reviewed - No data to display  Imaging Review No results found. I have personally reviewed and evaluated these images and lab results as part of my medical decision-making.   EKG Interpretation None      MDM   Final diagnoses:  Migraine without aura and without status migrainosus, not intractable    Patient presents with a migraine that started earlier this morning. The pain is consistent with her past migraines type headaches. She has no neurologic deficits. She did report associated blurry vision which she's had before with her migraines but her vision has normalized now. She doesn't have any other symptoms that would be more suggestive of subarachnoid hemorrhage or meningitis. I did offer her a migraine cocktail but she states she did not like the way it made her so drowsy. She did agree to the Reglan and Decadron. She cannot take Toradol or she has an allergy to ibuprofen. She was discharged home in good condition. She was encouraged to establish care with a PCP. She was given resources for possible outpatient follow-up. Return precautions were  given.  I personally performed the services described in this documentation, which was scribed in my presence.  The recorded information has been reviewed and considered.   Rolan BuccoMelanie Sharrie Self, MD 09/04/15 [id number]

## 2015-09-11 ENCOUNTER — Encounter (HOSPITAL_COMMUNITY): Payer: Self-pay | Admitting: Emergency Medicine

## 2015-09-11 ENCOUNTER — Emergency Department (HOSPITAL_COMMUNITY)
Admission: EM | Admit: 2015-09-11 | Discharge: 2015-09-12 | Disposition: A | Discharge status: Home / Self Care | Payer: Self-pay | Attending: Emergency Medicine | Admitting: Emergency Medicine

## 2015-09-11 DIAGNOSIS — G43909 Migraine, unspecified, not intractable, without status migrainosus: Secondary | ICD-10-CM | POA: Insufficient documentation

## 2015-09-11 NOTE — ED Notes (Signed)
Pt c/o migraine that started yesterday.  Extensive hx of same.  States that vomiting started today.  Sensitive to light and sound.

## 2015-09-12 NOTE — ED Provider Notes (Signed)
CSN: [id number]     Arrival date & time 09/11/15  2128 History  By signing my name below, I, Phillis Haggis, attest that this documentation has been prepared under the direction and in the presence of [employer], PA-C. Electronically Signed: Phillis Haggis, ED Scribe. 09/12/2015. 12:24 AM.   Chief Complaint  Patient presents with  . Migraine   The history is provided by the patient. No language interpreter was used.  HPI Comments: Judy Fisher is a 30 y.o. Female with a hx of recurrent migraines who presents to the Emergency Department complaining of gradually worsening, pulsing, bilateral temporal headache onset one day ago. She reports associated nausea and vomiting. She states that she had stressors yesterday that caused a panic attack and the headache began after. Pt has been taking Imitrex for her chronic headaches. Pt was sent to the ED from her work. She denies blurred vision, photophobia, numbness, or weakness.   Past Medical History  Diagnosis Date  . B12 deficiency   . Short bowel syndrome   . Migraines   . Heart murmur    Past Surgical History  Procedure Laterality Date  . Abdominal surgery     Family History  Problem Relation Age of Onset  . Hypertension Father   . Heart attack Father 90    Three separate MIs   Social History  Substance Use Topics  . Smoking status: Never Smoker   . Smokeless tobacco: None  . Alcohol Use: No   OB History    Gravida Para Term Preterm AB TAB SAB Ectopic Multiple Living   0 0 0 0 0 0 0 0       Review of Systems  Eyes: Negative for photophobia and visual disturbance.  Gastrointestinal: Positive for nausea and vomiting.  Neurological: Positive for headaches. Negative for weakness and numbness.  All other systems reviewed and are negative.  Allergies  Ibuprofen; Pollen extract; and Benadryl  Home Medications   Prior to Admission medications   Medication Sig Start Date End Date Taking? Authorizing Provider   butalbital-acetaminophen-caffeine (FIORICET) 50-325-40 MG tablet Take 1-2 tablets by mouth every 6 (six) hours as needed for headache. 07/27/15 07/26/16 Yes Shari Upstill, PA-C  SUMAtriptan (IMITREX) 100 MG tablet Take 1 tablet (100 mg total) by mouth once as needed for migraine. May repeat in 2 hours if headache persists or recurs. 08/14/15 08/13/16 Yes Charles M Beers, PA-C   BP 99/73 mmHg  Pulse 109  Temp(Src) 98 F (36.7 C) (Oral)  Resp 18  SpO2 97%  LMP 08/27/2015 (Approximate)   Physical Exam Constitutional: She is oriented to person, place, and time. She appears well-developed and well-nourished. No distress.  Nontoxic appearing, in no distress. Sleeping, initially, in no visible or audible discomfort.  HENT:  Head: Normocephalic and atraumatic.  Eyes: Conjunctivae and EOM are normal. Pupils are equal, round, and reactive to light. No scleral icterus.  Neck: Normal range of motion.  No nuchal rigidity or meningismus  Cardiovascular: Regular rhythm, regular rate, and intact distal pulses.   Pulmonary/Chest: Effort normal. No respiratory distress. She has no wheezes.  Respirations even and unlabored. Lungs clear bilaterally.  Musculoskeletal: Normal range of motion.  Neurological: She is alert and oriented to person, place, and time. She displays normal reflexes. No cranial nerve deficit. Coordination normal.  GCS 15. Speech is oriented. No cranial nerve deficits appreciated; symmetric eyebrow raise, no facial drooping, tongue midline. Patient has equal grip strength bilaterally with 5/5 strength against resistance in all major muscle  groups bilaterally. Sensation to light touch intact. Patient moves extremities without ataxia. She is ambulatory with steady gait. Skin: Skin is warm and dry. No rash noted. She is not diaphoretic. No erythema. No pallor.  Psychiatric: She has a normal mood and affect. Her behavior is normal.  Nursing note and vitals reviewed.  ED Course  Procedures  (including critical care time) DIAGNOSTIC STUDIES: Oxygen Saturation is 97% on RA, normal by my interpretation.    COORDINATION OF CARE: 12:24 AM-Discussed treatment plan with pt at bedside and pt agreed to plan.   Labs Review Labs Reviewed - No data to display  Imaging Review No results found. I have personally reviewed and evaluated these images and lab results as part of my medical decision-making.   EKG Interpretation None      MDM   Final diagnoses:  Migraine without status migrainosus, not intractable, unspecified migraine type    Patient with long-standing history of migraine headaches presents to the ED for evaluation of a migraine headache. No history of head injury or trauma. Patient is afebrile and without nuchal rigidity or meningismus. No focal neurologic deficits appreciated on exam. She has been taking her home headache medication with mild-moderate improvement.   Low suspicion for emergent intracranial process, especially given chronicity of symptoms and history of similar migraines. Patient states that she feels comfortable managing her symptoms further on an outpatient basis. She is requesting to "go home to sleep"; states that she only came to get a work note for her job as they requested that she provide one. No indication for further emergent work up or imaging. Return precautions given at discharge. Patient discharged in satisfactory condition with no unaddressed concerns.  I personally performed the services described in this documentation, which was scribed in my presence. The recorded information has been reviewed and is accurate.      Antony Madura, PA-C 09/14/15 2330  Paula Libra, MD 09/18/15 2245

## 2015-09-27 ENCOUNTER — Emergency Department (HOSPITAL_COMMUNITY)
Admission: EM | Admit: 2015-09-27 | Discharge: 2015-09-28 | Disposition: A | Discharge status: Home / Self Care | Payer: Self-pay | Attending: Emergency Medicine | Admitting: Emergency Medicine

## 2015-09-27 ENCOUNTER — Encounter (HOSPITAL_COMMUNITY): Payer: Self-pay | Admitting: Emergency Medicine

## 2015-09-27 DIAGNOSIS — G43809 Other migraine, not intractable, without status migrainosus: Secondary | ICD-10-CM | POA: Insufficient documentation

## 2015-09-27 NOTE — ED Triage Notes (Signed)
Patient has a hx of migraines. Patient was out work and had a blackout due to having a migraine. Patient was not able to take her Imitrex. Patient states that work will not let her come back until she gets checked out. Patient is still having a migraine.

## 2015-09-28 NOTE — Discharge Instructions (Signed)
You have been give a referral to the [facility].

## 2015-09-28 NOTE — ED Provider Notes (Signed)
[facility]-EMERGENCY DEPT Provider Note   CSN: [id number] Arrival date & time: 09/27/15  2326  By signing my name below, I, Alyssa Grove, attest that this documentation has been prepared under the direction and in the presence of Earley Favor, NP. Electronically Signed: Alyssa Grove, ED Scribe. 09/28/15. 1:36 AM.  First MD Initiated Contact with Patient 09/28/15 0129    History   Chief Complaint Chief Complaint  Patient presents with  . Migraine   The history is provided by the patient. No language interpreter was used.    HPI Comments: Judy Fisher is a 30 y.o. female with PMHx of Migraines who presents to the Emergency Department complaining of an migraine onset 10:30 PM. Pt states she had a "blackout" where she couldn't see anything, but she was aware, for several seconds. Pt states she has photophobia and nausea with her migraines. Pt states these symptoms are not uncommon in terms of the pain level. She states she currently still has her migraine. Pt states her job wanted her to be seen by a physician before returning to work. Pt states she could not take her Imitrex, which relieves her symptoms, because she left it at home. Pt states that when she was first prescribed Imitrex, she took her Imitrex for a week straight, but now she can go 2-3 days without taking a dose. Pt states she does not have PCP and received her last prescription from [facility]. Pt states she has been trying to stay out of her house due to construction noise. Pt reports FHx of Migraines Mother.   Past Medical History:  Diagnosis Date  . B12 deficiency   . Heart murmur   . Migraines   . Short bowel syndrome     There are no active problems to display for this patient.   Past Surgical History:  Procedure Laterality Date  . ABDOMINAL SURGERY      OB History    Gravida Para Term Preterm AB Living   0 0 0 0 0     SAB TAB Ectopic Multiple Live Births   0 0 0           Home Medications    Prior to  Admission medications   Medication Sig Start Date End Date Taking? Authorizing Provider  butalbital-acetaminophen-caffeine (FIORICET) 50-325-40 MG tablet Take 1-2 tablets by mouth every 6 (six) hours as needed for headache. 07/27/15 07/26/16  Elpidio Anis, PA-C  SUMAtriptan (IMITREX) 100 MG tablet Take 1 tablet (100 mg total) by mouth once as needed for migraine. May repeat in 2 hours if headache persists or recurs. 08/14/15 08/13/16  Evangeline Dakin, PA-C    Family History Family History  Problem Relation Age of Onset  . Hypertension Father   . Heart attack Father 45    Three separate MIs    Social History Social History  Substance Use Topics  . Smoking status: Never Smoker  . Smokeless tobacco: Never Used  . Alcohol use No     Allergies   Ibuprofen; Pollen extract; and Benadryl [diphenhydramine]   Review of Systems Review of Systems  Constitutional: Negative for fever.  HENT: Negative for congestion.   Eyes: Positive for photophobia and visual disturbance.  Respiratory: Negative for shortness of breath.   Cardiovascular: Negative for chest pain.  Gastrointestinal: Positive for nausea. Negative for vomiting.  Genitourinary: Negative for dysuria.  Musculoskeletal: Negative for neck pain and neck stiffness.  Neurological: Positive for headaches. Negative for dizziness.  All other systems reviewed and  are negative.    Physical Exam Updated Vital Signs BP 103/80 (BP Location: Right Arm)   Pulse 73   Temp 98.2 F (36.8 C) (Oral)   Resp 13   Ht 5\' 1"  (1.549 m)   Wt 105 lb (47.6 kg)   LMP 08/27/2015 (Approximate)   SpO2 96%   BMI 19.84 kg/m   Physical Exam  Constitutional: She appears well-developed and well-nourished.  HENT:  Head: Normocephalic.  Eyes: Pupils are equal, round, and reactive to light.  Neck: Normal range of motion.  Cardiovascular: Normal rate.   Pulmonary/Chest: Effort normal.  Abdominal: Soft.  Musculoskeletal: Normal range of motion.    Neurological: She is alert.  Skin: Skin is warm.  Psychiatric: She has a normal mood and affect.  Nursing note and vitals reviewed.    ED Treatments / Results  DIAGNOSTIC STUDIES: Oxygen Saturation is 96% on RA, adequate by my interpretation.    COORDINATION OF CARE: 1:34 AM Discussed treatment plan with pt at bedside which includes  discharge and work noteand pt agreed to plan.  Labs (all labs ordered are listed, but only abnormal results are displayed) Labs Reviewed - No data to display  EKG  EKG Interpretation None       Radiology No results found.  Procedures Procedures (including critical care time)  Medications Ordered in ED Medications - No data to display   Initial Impression / Assessment and Plan / ED Course  I have reviewed the triage vital signs and the nursing notes.  Pertinent labs & imaging results that were available during my care of the patient were reviewed by me and considered in my medical decision making (see chart for details).  Clinical Course   Patient states she would prefer to take her Imitrex when she gets home rather than receive pain control emergency department    Final Clinical Impressions(s) / ED Diagnoses   Final diagnoses:  None    New Prescriptions New Prescriptions   No medications on file   I personally performed the services described in this documentation, which was scribed in my presence. The recorded information has been reviewed and is accurate.    Earley FavorGail Ronika Kelson, NP 09/28/15 0153    Dione Boozeavid Glick, MD 09/29/15 [phone]

## 2015-10-29 ENCOUNTER — Emergency Department (HOSPITAL_COMMUNITY)
Admission: EM | Admit: 2015-10-29 | Discharge: 2015-10-29 | Discharge status: Against Medical Advice | Payer: Self-pay | Attending: Dermatology | Admitting: Dermatology

## 2015-10-29 ENCOUNTER — Encounter (HOSPITAL_COMMUNITY): Payer: Self-pay

## 2015-10-29 DIAGNOSIS — Y929 Unspecified place or not applicable: Secondary | ICD-10-CM | POA: Insufficient documentation

## 2015-10-29 DIAGNOSIS — Y999 Unspecified external cause status: Secondary | ICD-10-CM | POA: Insufficient documentation

## 2015-10-29 DIAGNOSIS — Y939 Activity, unspecified: Secondary | ICD-10-CM | POA: Insufficient documentation

## 2015-10-29 DIAGNOSIS — Z79899 Other long term (current) drug therapy: Secondary | ICD-10-CM | POA: Insufficient documentation

## 2015-10-29 DIAGNOSIS — T63441A Toxic effect of venom of bees, accidental (unintentional), initial encounter: Secondary | ICD-10-CM | POA: Insufficient documentation

## 2015-10-29 NOTE — ED Triage Notes (Signed)
Pt was stung by a bee last night. Pt roommate sprayed benadryl spray on her foot last night where she was stung. Pt is allergic to benadryl. Pt states that she has been itchy all day and has noticed some spots on her foot. Denies shortness of breath. A&Ox4.

## 2015-11-12 ENCOUNTER — Emergency Department (HOSPITAL_COMMUNITY)
Admission: EM | Admit: 2015-11-12 | Discharge: 2015-11-12 | Disposition: A | Discharge status: Home / Self Care | Payer: Self-pay | Attending: Emergency Medicine | Admitting: Emergency Medicine

## 2015-11-12 ENCOUNTER — Encounter (HOSPITAL_COMMUNITY): Payer: Self-pay | Admitting: Emergency Medicine

## 2015-11-12 ENCOUNTER — Emergency Department (HOSPITAL_COMMUNITY): Payer: Self-pay

## 2015-11-12 DIAGNOSIS — W228XXA Striking against or struck by other objects, initial encounter: Secondary | ICD-10-CM | POA: Insufficient documentation

## 2015-11-12 DIAGNOSIS — S9032XA Contusion of left foot, initial encounter: Secondary | ICD-10-CM | POA: Insufficient documentation

## 2015-11-12 DIAGNOSIS — Y999 Unspecified external cause status: Secondary | ICD-10-CM | POA: Insufficient documentation

## 2015-11-12 DIAGNOSIS — Z79899 Other long term (current) drug therapy: Secondary | ICD-10-CM | POA: Insufficient documentation

## 2015-11-12 DIAGNOSIS — Y929 Unspecified place or not applicable: Secondary | ICD-10-CM | POA: Insufficient documentation

## 2015-11-12 DIAGNOSIS — Y939 Activity, unspecified: Secondary | ICD-10-CM | POA: Insufficient documentation

## 2015-11-12 IMAGING — CR DG FOOT COMPLETE 3+V*L*
3 series · 3 of 3 positions shown · non-contrast
Comparison: None.

CLINICAL DATA: Initial encounter for Pt hit left foot on door x 3
days ago. Bruising and pain to distal 4th and 5th metatarsals and
4th and 5th toes.

EXAM:
LEFT FOOT - COMPLETE 3+ VIEW

[x foot ap left]
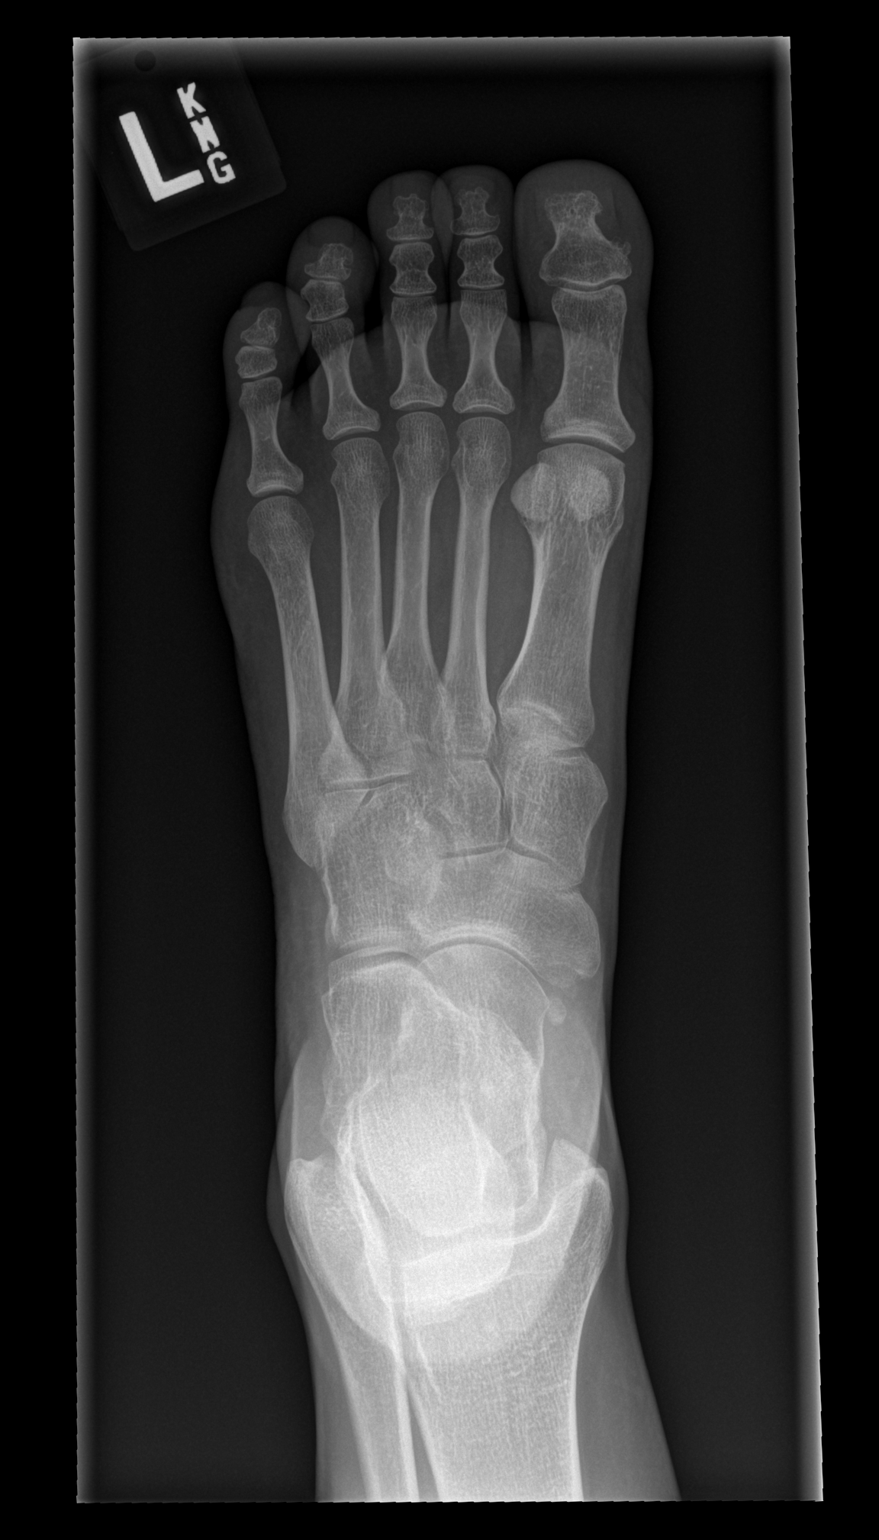

[x foot obl left]
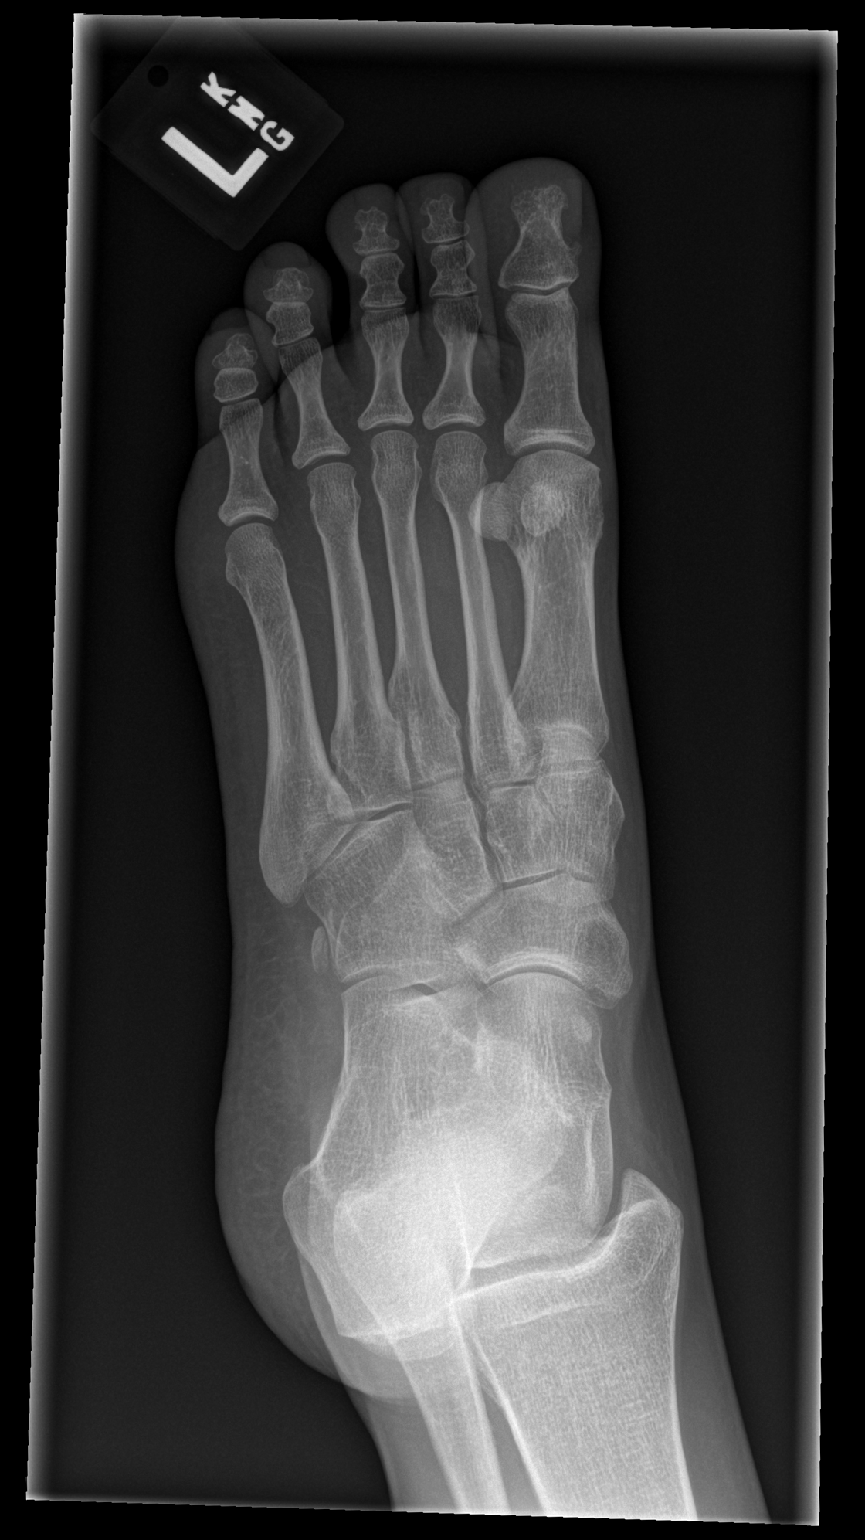

[x foot lat left]
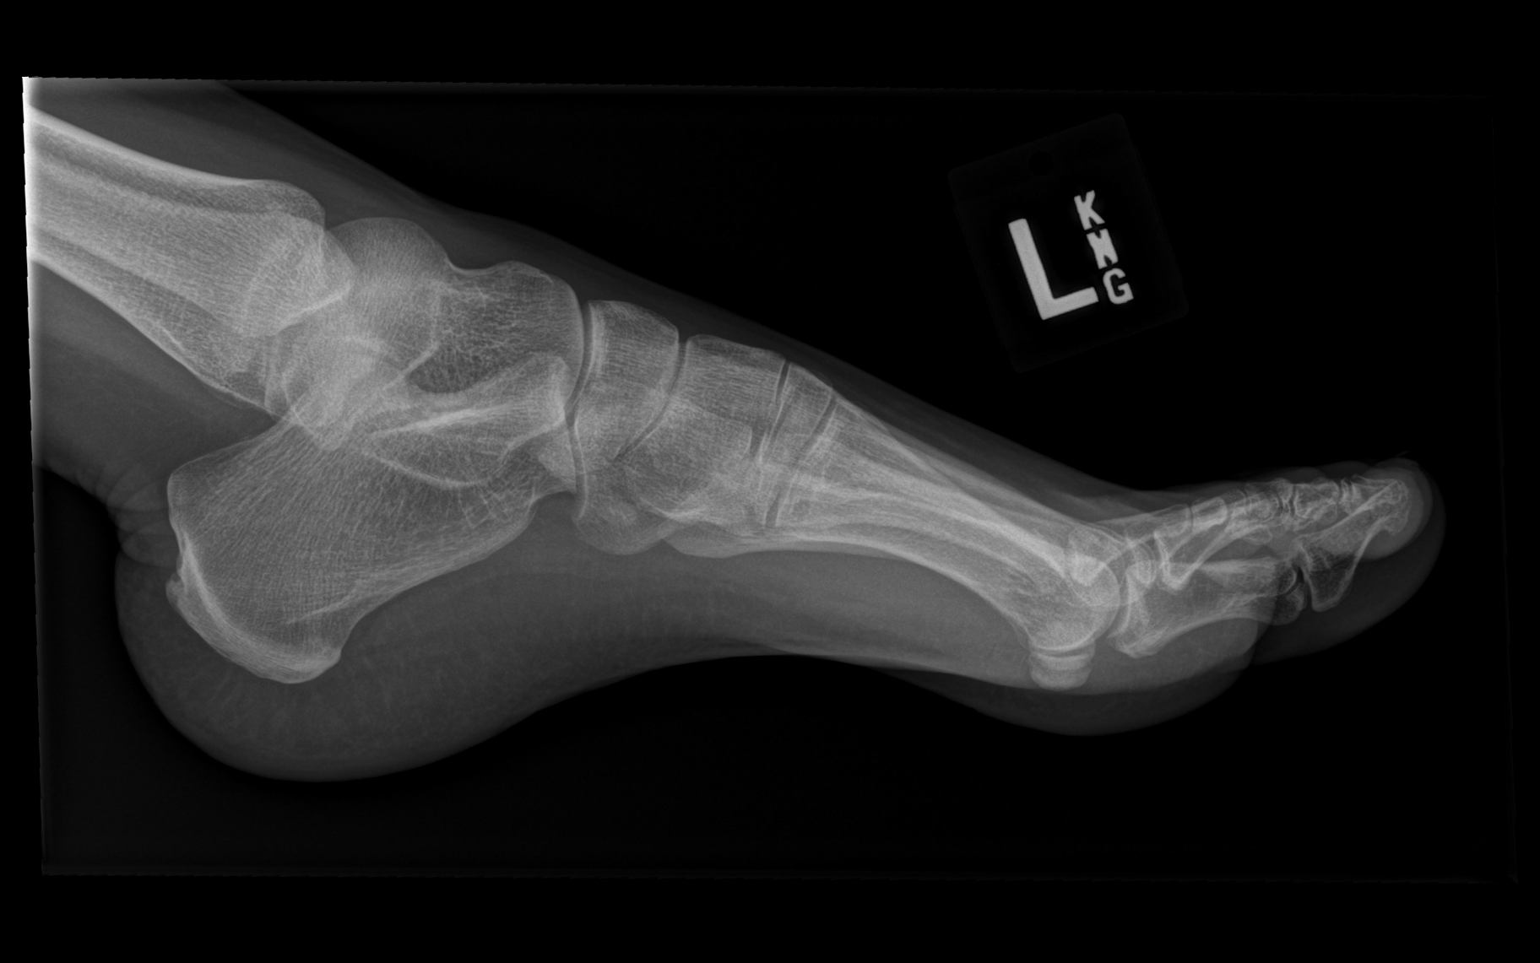

[3 of 3 positions shown; findings below may reference images not displayed]

FINDINGS: No acute fracture or dislocation. There may be mild soft tissue
swelling adjacent to the fifth metatarsal phalangeal joint.
IMPRESSION: No acute osseous abnormality.

## 2015-11-12 MED ORDER — TRAMADOL HCL 50 MG PO TABS: 50.0000 mg | ORAL_TABLET | Freq: Four times a day (QID) | ORAL | 0 refills | Status: AC | PRN

## 2015-11-12 NOTE — Discharge Instructions (Signed)
Your x-rays were normal.  Return here as needed.  Ice and elevate her foot.  Follow-up as needed with the orthopedist

## 2015-11-12 NOTE — ED Triage Notes (Signed)
Pt stated that she struck her l/foot on a door 3 days ago. Pt has difficulty bending the 4th and 5th toes

## 2015-11-12 NOTE — ED Notes (Signed)
Ortho Tech paged for place post-op shoe and ace wrap on pt.

## 2015-11-12 NOTE — ED Provider Notes (Signed)
[facility]-EMERGENCY DEPT Provider Note   CSN: [id number] Arrival date & time: 11/12/15  1814   By signing my name below, I, Judy Fisher, attest that this documentation has been prepared under the direction and in the presence of [employer], PA-C. Electronically Signed: Christel Fisher, Scribe. 11/12/2015. 6:38 PM.   History   Chief Complaint Chief Complaint  Patient presents with  . Foot Pain    3 day hx of l/foot pain    The history is provided by the patient. No language interpreter was used.   HPI Comments:  Judy Fisher is a 30 y.o. female who presents to the Emergency Department complaining of constant L foot pain and swelling x3 days. Pt states that she hit her foot on a door. Pt reports that she now is having difficulty bending the 4th and 5th toes of her L foot. Pt states that the pain is exacerbated when walking and touching the affected area. Pt has been icing and elevating her L foot with no relief. Pt states that she has adverse reactions to ibuprofen and tylenol.   Past Medical History:  Diagnosis Date  . B12 deficiency   . Heart murmur   . Migraines   . Short bowel syndrome     There are no active problems to display for this patient.   Past Surgical History:  Procedure Laterality Date  . ABDOMINAL SURGERY      OB History    Gravida Para Term Preterm AB Living   0 0 0 0 0     SAB TAB Ectopic Multiple Live Births   0 0 0           Home Medications    Prior to Admission medications   Medication Sig Start Date End Date Taking? Authorizing Provider  butalbital-acetaminophen-caffeine (FIORICET) 50-325-40 MG tablet Take 1-2 tablets by mouth every 6 (six) hours as needed for headache. 07/27/15 07/26/16  Elpidio Anis, PA-C  SUMAtriptan (IMITREX) 100 MG tablet Take 1 tablet (100 mg total) by mouth once as needed for migraine. May repeat in 2 hours if headache persists or recurs. 08/14/15 08/13/16  Evangeline Dakin, PA-C    Family History Family History    Problem Relation Age of Onset  . Hypertension Father   . Heart attack Father 87    Three separate MIs  . Stroke Father     Social History Social History  Substance Use Topics  . Smoking status: Never Smoker  . Smokeless tobacco: Never Used  . Alcohol use No     Allergies   Ibuprofen; Pollen extract; and Benadryl [diphenhydramine]   Review of Systems Review of Systems  Musculoskeletal: Positive for arthralgias, gait problem and joint swelling.     Physical Exam Updated Vital Signs BP 119/75 (BP Location: Left Arm)   Pulse 82   Temp 98.2 F (36.8 C) (Oral)   Resp 20   LMP 10/12/2015 (Approximate)   SpO2 100%   Physical Exam  Constitutional: She appears well-developed and well-nourished. No distress.  HENT:  Head: Normocephalic and atraumatic.  Eyes: Conjunctivae are normal.  Cardiovascular: Normal rate.   Pulmonary/Chest: Effort normal.  Abdominal: She exhibits no distension.  Musculoskeletal:  Bruising and swelling over the mid L foot and the 3rd 4th and 5th toes. Pain with palpation.   Neurological: She is alert.  Skin: Skin is warm and dry.  Psychiatric: She has a normal mood and affect.  Nursing note and vitals reviewed.    ED Treatments / Results  DIAGNOSTIC STUDIES:  Oxygen Saturation is 100% on RA, normal by my interpretation.    COORDINATION OF CARE:  6:38 PM Discussed treatment plan with pt at bedside and pt agreed to plan.   Labs (all labs ordered are listed, but only abnormal results are displayed) Labs Reviewed - No data to display  EKG  EKG Interpretation None       Radiology No results found.  Procedures Procedures (including critical care time)  Medications Ordered in ED Medications - No data to display   Initial Impression / Assessment and Plan / ED Course  I have reviewed the triage vital signs and the nursing notes.  Pertinent labs & imaging results that were available during my care of the patient were reviewed  by me and considered in my medical decision making (see chart for details).  Clinical Course    Patient be treated for contusion of the foot.  Told to return here as needed.  Patient agrees the plan and all questions were answered.  We will have her follow with orthopedics as needed  Final Clinical Impressions(s) / ED Diagnoses   Final diagnoses:  None    New Prescriptions New Prescriptions   No medications on file  I personally performed the services described in this documentation, which was scribed in my presence. The recorded information has been reviewed and is accurate.     Charlestine NightChristopher Rhemi Balbach, PA-C 11/12/15 1921    Canary Brimhristopher J Tegeler, MD 11/13/15 [phone]

## 2016-02-09 ENCOUNTER — Encounter (HOSPITAL_COMMUNITY): Payer: Self-pay | Admitting: Emergency Medicine

## 2016-02-09 ENCOUNTER — Emergency Department (HOSPITAL_COMMUNITY)
Admission: EM | Admit: 2016-02-09 | Discharge: 2016-02-09 | Disposition: A | Discharge status: Home / Self Care | Payer: Self-pay | Attending: Emergency Medicine | Admitting: Emergency Medicine

## 2016-02-09 DIAGNOSIS — Y929 Unspecified place or not applicable: Secondary | ICD-10-CM | POA: Insufficient documentation

## 2016-02-09 DIAGNOSIS — Y999 Unspecified external cause status: Secondary | ICD-10-CM | POA: Insufficient documentation

## 2016-02-09 DIAGNOSIS — X58XXXA Exposure to other specified factors, initial encounter: Secondary | ICD-10-CM | POA: Insufficient documentation

## 2016-02-09 DIAGNOSIS — S29012A Strain of muscle and tendon of back wall of thorax, initial encounter: Secondary | ICD-10-CM | POA: Insufficient documentation

## 2016-02-09 DIAGNOSIS — Y939 Activity, unspecified: Secondary | ICD-10-CM | POA: Insufficient documentation

## 2016-02-09 DIAGNOSIS — T148XXA Other injury of unspecified body region, initial encounter: Secondary | ICD-10-CM

## 2016-02-09 DIAGNOSIS — J069 Acute upper respiratory infection, unspecified: Secondary | ICD-10-CM

## 2016-02-09 DIAGNOSIS — Z79899 Other long term (current) drug therapy: Secondary | ICD-10-CM | POA: Insufficient documentation

## 2016-02-09 MED ORDER — OXYMETAZOLINE HCL 0.05 % NA SOLN: 1.0000 | Freq: Two times a day (BID) | NASAL | 0 refills | Status: AC

## 2016-02-09 MED ORDER — BENZONATATE 100 MG PO CAPS: 200.0000 mg | ORAL_CAPSULE | Freq: Two times a day (BID) | ORAL | 0 refills | Status: AC | PRN

## 2016-02-09 MED ORDER — METHOCARBAMOL 500 MG PO TABS: 500.0000 mg | ORAL_TABLET | Freq: Two times a day (BID) | ORAL | 0 refills | Status: AC

## 2016-02-09 NOTE — Discharge Instructions (Signed)
Take your medications as prescribed. You may also take Tylenol and/or ibuprofen as prescribed over-the-counter as needed for pain relief. Continue drinking fluids at home to remain hydrated. I recommend refraining from doing any heavy lifting, repetitive movements without worsen your symptoms. Please follow up with a primary care provider from the Resource Guide provided below in one week if your symptoms are not improve Please return to the Emergency Department if symptoms worsen or new onset of fever, headache, neck stiffness, numbness, tingling, weakness, coughing up blood, difficulty breathing, chest pain, vomiting, unable to keep fluids down.

## 2016-02-09 NOTE — ED Triage Notes (Signed)
Patient reports middle/upper back pain starting this morning. Denies trauma/injury/fall. Patient reports cough and right ear pain.

## 2016-02-09 NOTE — ED Provider Notes (Signed)
[facility]-EMERGENCY DEPT Provider Note   CSN: [id number] Arrival date & time: 02/09/16  1743   By signing my name below, I, Teofilo Pod, attest that this documentation has been prepared under the direction and in the presence of Melburn Hake, [place]. Electronically Signed: Teofilo Pod, ED Scribe. 02/09/2016. 6:45 PM.   History   Chief Complaint Chief Complaint  Patient presents with  . Back Pain    Upper    The history is provided by the patient. No language interpreter was used.   HPI Comments:  Judy Fisher is a 30 y.o. female who presents to the Emergency Department complaining of constant mid/upper back pain since this AM. Pt states that her back pain is exacerbated when she coughs. Pt complains of associated ear pain, rhinorrhea, nasal congestion, cough. Pt notes that these symptoms have been present for ~1 week. Pt denies any previous similar recent back pain, and denies recent injury/trauma. Pt used peroxide in her ears with moderate relief. No alleviating factors noted for her other symptoms. Pt denies fever, sore throat, CP, SOB, abdominal pain, vomiting, rash, numbness, tingling, weakness, saddle anesthesia, urinary/bowel incontinence. Denies taking any meds for her sxs. Denies any known sick contacts.  Past Medical History:  Diagnosis Date  . B12 deficiency   . Heart murmur   . Migraines   . Short bowel syndrome     There are no active problems to display for this patient.   Past Surgical History:  Procedure Laterality Date  . ABDOMINAL SURGERY      OB History    Gravida Para Term Preterm AB Living   0 0 0 0 0     SAB TAB Ectopic Multiple Live Births   0 0 0           Home Medications    Prior to Admission medications   Medication Sig Start Date End Date Taking? Authorizing Provider  benzonatate (TESSALON) 100 MG capsule Take 2 capsules (200 mg total) by mouth 2 (two) times daily as needed for cough. 02/09/16   Barrett Henle, PA-C    butalbital-acetaminophen-caffeine (FIORICET) [phone] MG tablet Take 1-2 tablets by mouth every 6 (six) hours as needed for headache. 07/27/15 07/26/16  Elpidio Anis, PA-C  methocarbamol (ROBAXIN) 500 MG tablet Take 1 tablet (500 mg total) by mouth 2 (two) times daily. 02/09/16   Barrett Henle, PA-C  oxymetazoline (AFRIN NASAL SPRAY) 0.05 % nasal spray Place 1 spray into both nostrils 2 (two) times daily. Spray once into each nostril twice daily for up to the next 3 days. Do not use for more than 3 days to prevent rebound rhinorrhea. 02/09/16   Barrett Henle, PA-C  SUMAtriptan (IMITREX) 100 MG tablet Take 1 tablet (100 mg total) by mouth once as needed for migraine. May repeat in 2 hours if headache persists or recurs. 08/14/15 08/13/16  Charmayne Sheer Beers, PA-C  traMADol (ULTRAM) 50 MG tablet Take 1 tablet (50 mg total) by mouth every 6 (six) hours as needed for severe pain. 11/12/15   Charlestine Night, PA-C    Family History Family History  Problem Relation Age of Onset  . Hypertension Father   . Heart attack Father 50    Three separate MIs  . Stroke Father     Social History Social History  Substance Use Topics  . Smoking status: Never Smoker  . Smokeless tobacco: Never Used  . Alcohol use No     Allergies   Ibuprofen; Pollen extract;  Benadryl [diphenhydramine]; and Tylenol [acetaminophen]   Review of Systems Review of Systems  Constitutional: Negative for fever.  HENT: Positive for congestion, ear pain and rhinorrhea. Negative for sore throat.   Respiratory: Positive for cough. Negative for shortness of breath.   Cardiovascular: Negative for chest pain.  Musculoskeletal: Positive for back pain.  Neurological: Negative for weakness and numbness.     Physical Exam Updated Vital Signs BP 110/74 (BP Location: Right Arm)   Pulse 84   Temp 98.5 F (36.9 C) (Oral)   Resp 18   Ht 5\' 1"  (1.549 m)   Wt 47.6 kg   LMP 02/07/2016   SpO2 100%   BMI 19.84 kg/m    Physical Exam  Constitutional: She is oriented to person, place, and time. She appears well-developed and well-nourished. No distress.  HENT:  Head: Normocephalic and atraumatic.  Right Ear: Tympanic membrane normal.  Left Ear: Tympanic membrane normal.  Mouth/Throat: Uvula is midline, oropharynx is clear and moist and mucous membranes are normal. No oropharyngeal exudate, posterior oropharyngeal edema, posterior oropharyngeal erythema or tonsillar abscesses. No tonsillar exudate.  Eyes: Conjunctivae and EOM are normal. Right eye exhibits no discharge. Left eye exhibits no discharge. No scleral icterus.  Neck: Normal range of motion. Neck supple.  Cardiovascular: Normal rate, regular rhythm, normal heart sounds and intact distal pulses.   Pulmonary/Chest: Effort normal and breath sounds normal. No respiratory distress. She has no wheezes. She has no rales. She exhibits no tenderness.  Abdominal: Soft. Bowel sounds are normal. She exhibits no distension. There is no tenderness.  Musculoskeletal: Normal range of motion. She exhibits tenderness. She exhibits no edema or deformity.  No midline C, T, or L tenderness. TTP over bilateral cervical and thoracic paraspinal muscles, trapezius, and bilateral rhomboids. Full range of motion of neck and back. Full range of motion of bilateral upper and lower extremities, with 5/5 strength. Sensation intact. 2+ radial and PT pulses. Cap refill <2 seconds. Patient able to stand and ambulate without assistance.    Lymphadenopathy:    She has no cervical adenopathy.  Neurological: She is alert and oriented to person, place, and time.  Skin: Skin is warm and dry. She is not diaphoretic.  Nursing note and vitals reviewed.    ED Treatments / Results  DIAGNOSTIC STUDIES:  Oxygen Saturation is 96% on RA, normal by my interpretation.    COORDINATION OF CARE:  6:45 PM Discussed treatment plan with pt at bedside and pt agreed to plan.   Labs (all labs  ordered are listed, but only abnormal results are displayed) Labs Reviewed - No data to display  EKG  EKG Interpretation None       Radiology No results found.  Procedures Procedures (including critical care time)  Medications Ordered in ED Medications - No data to display   Initial Impression / Assessment and Plan / ED Course  I have reviewed the triage vital signs and the nursing notes.  Pertinent labs & imaging results that were available during my care of the patient were reviewed by me and considered in my medical decision making (see chart for details).  Clinical Course     Pt presents with upper back pain that started this morning. Denies any recent injury, fall, or heavy lifting. Pain worse with coughing. Reports associated cough, ear pain, and nasal congestion for past week. Denies fever. No back pain red flags. VSS. Exam revealed mild TTP over bilateral cervical and thoracic paraspinal muscles, trapezius and rhomboids. No neuro  deficits. Lungs CTAB. Suspect sxs are likely due to viral URI and feel that pt's back pain is likely from muscle strain associated with coughing for the past week. Pt afebrile with clear lungs on exam, do not feel that CXR is warranted at this time. Plan to d/c pt home with symptomatic tx and PCP follow up. Discussed return precautions.   Final Clinical Impressions(s) / ED Diagnoses   Final diagnoses:  Muscle strain  Viral upper respiratory tract infection    New Prescriptions Discharge Medication List as of 02/09/2016  6:55 PM    START taking these medications   Details  benzonatate (TESSALON) 100 MG capsule Take 2 capsules (200 mg total) by mouth 2 (two) times daily as needed for cough., Starting Wed 02/09/2016, Print    methocarbamol (ROBAXIN) 500 MG tablet Take 1 tablet (500 mg total) by mouth 2 (two) times daily., Starting Wed 02/09/2016, Print    oxymetazoline (AFRIN NASAL SPRAY) 0.05 % nasal spray Place 1 spray into both nostrils  2 (two) times daily. Spray once into each nostril twice daily for up to the next 3 days. Do not use for more than 3 days to prevent rebound rhinorrhea., Starting Wed 02/09/2016, Print       I personally performed the services described in this documentation, which was scribed in my presence. The recorded information has been reviewed and is accurate.     Satira Sarkicole Elizabeth MizpahNadeau, [place]-C 02/10/16 [id number]    Lorre NickAnthony Allen, MD 02/12/16 [phone]
